# Patient Record
Sex: Female | Born: 1950 | Race: White | Hispanic: No | Marital: Married | Smoking: Former smoker
Health system: Southern US, Community
[De-identification: ages and names within clinical notes are randomized; demographics above are authoritative.]

## PROBLEM LIST (undated history)

## (undated) DIAGNOSIS — J439 Emphysema, unspecified: Secondary | ICD-10-CM

## (undated) HISTORY — PX: OTHER SURGICAL HISTORY: SHX169

---

## 2014-12-04 DIAGNOSIS — Z85828 Personal history of other malignant neoplasm of skin: Secondary | ICD-10-CM | POA: Insufficient documentation

## 2015-09-24 ENCOUNTER — Encounter: Payer: Self-pay | Admitting: Gastroenterology

## 2015-09-24 NOTE — Telephone Encounter (Signed)
Error

## 2016-04-14 ENCOUNTER — Ambulatory Visit
Admission: RE | Admit: 2016-04-14 | Discharge: 2016-04-14 | Disposition: A | Discharge status: Home / Self Care | Payer: Self-pay | Source: Ambulatory Visit | Attending: Oncology | Admitting: Oncology

## 2016-04-14 ENCOUNTER — Ambulatory Visit: Payer: Self-pay | Attending: Oncology

## 2016-04-14 VITALS — BP 118/62 | HR 74 | Temp 96.6°F | Resp 18 | Ht 62.6 in | Wt 103.1 lb

## 2016-04-14 DIAGNOSIS — Z0000 Encounter for general adult medical examination without abnormal findings: Secondary | ICD-10-CM

## 2016-04-14 NOTE — Progress Notes (Signed)
Patient had last mammogram at [facility].  [facility] to notify of normal mammogram results.  Patient to return in one year for annual screening.  Copy to [facility].

## 2016-04-14 NOTE — Progress Notes (Signed)
Subjective:     [name], female   DOB: 10/14/1950, 65 y.o.   MRN: [id number]  HPI   Review of Systems     Objective:   Physical Exam  Pulmonary/Chest: Right breast exhibits no inverted nipple, no mass, no nipple discharge, no skin change and no tenderness. Left breast exhibits no inverted nipple, no mass, no nipple discharge, no skin change and no tenderness. Breasts are symmetrical.       Assessment:     65 [facility]  Patient screened, and meets [facility] eligibility.  Patient does not have insurance, Medicare or Medicaid.  Handout given on Affordable Care Act. Instructed patient on breast self-exam using teach back method.  CBE unremarkable.  Prior to living in [place], patient lived in [place].  [facility].    Plan:     Sent for bilateral screening mammogram.

## 2016-04-28 ENCOUNTER — Inpatient Hospital Stay
Admission: RE | Admit: 2016-04-28 | Discharge: 2016-04-28 | Disposition: A | Discharge status: Home / Self Care | Payer: Self-pay | Source: Ambulatory Visit | Attending: *Deleted | Admitting: *Deleted

## 2016-04-28 ENCOUNTER — Other Ambulatory Visit: Payer: Self-pay | Admitting: *Deleted

## 2016-04-28 DIAGNOSIS — Z9289 Personal history of other medical treatment: Secondary | ICD-10-CM

## 2017-07-18 ENCOUNTER — Other Ambulatory Visit: Payer: Self-pay | Admitting: Family Medicine

## 2017-07-18 DIAGNOSIS — E2839 Other primary ovarian failure: Secondary | ICD-10-CM

## 2017-07-20 ENCOUNTER — Telehealth: Payer: Self-pay | Admitting: *Deleted

## 2017-07-20 NOTE — Telephone Encounter (Signed)
Received referral for low dose lung cancer screening CT scan. Message left at phone number listed in EMR for patient to call me back to facilitate scheduling scan.  

## 2017-07-27 ENCOUNTER — Telehealth: Payer: Self-pay | Admitting: *Deleted

## 2017-07-27 NOTE — Telephone Encounter (Signed)
Received referral for initial lung cancer screening scan. Contacted patient who would like to think about screening for now. She has my contact number when and if she decides to have lung screening scan.

## 2017-08-18 ENCOUNTER — Encounter: Payer: Self-pay | Admitting: *Deleted

## 2017-09-07 ENCOUNTER — Telehealth: Payer: Self-pay | Admitting: *Deleted

## 2017-09-07 DIAGNOSIS — Z122 Encounter for screening for malignant neoplasm of respiratory organs: Secondary | ICD-10-CM

## 2017-09-07 DIAGNOSIS — Z87891 Personal history of nicotine dependence: Secondary | ICD-10-CM

## 2017-09-07 NOTE — Telephone Encounter (Signed)
Received referral for initial lung cancer screening scan. Contacted patient and obtained smoking history,(current, 36 pack year) as well as answering questions related to screening process. Patient denies signs of lung cancer such as weight loss or hemoptysis. Patient denies comorbidity that would prevent curative treatment if lung cancer were found. Patient is scheduled for shared decision making visit and CT scan on 09/30/17.

## 2017-09-28 ENCOUNTER — Other Ambulatory Visit: Payer: Self-pay | Admitting: Family Medicine

## 2017-09-28 DIAGNOSIS — J449 Chronic obstructive pulmonary disease, unspecified: Secondary | ICD-10-CM

## 2017-09-30 ENCOUNTER — Inpatient Hospital Stay: Payer: Medicare Other | Attending: Nurse Practitioner | Admitting: Nurse Practitioner

## 2017-09-30 ENCOUNTER — Telehealth: Payer: Self-pay | Admitting: *Deleted

## 2017-09-30 ENCOUNTER — Encounter: Payer: Self-pay | Admitting: Nurse Practitioner

## 2017-09-30 ENCOUNTER — Ambulatory Visit
Admission: RE | Admit: 2017-09-30 | Discharge: 2017-09-30 | Disposition: A | Discharge status: Home / Self Care | Payer: Medicare Other | Source: Ambulatory Visit | Attending: Nurse Practitioner | Admitting: Nurse Practitioner

## 2017-09-30 DIAGNOSIS — J439 Emphysema, unspecified: Secondary | ICD-10-CM | POA: Insufficient documentation

## 2017-09-30 DIAGNOSIS — Z87891 Personal history of nicotine dependence: Secondary | ICD-10-CM

## 2017-09-30 DIAGNOSIS — Z122 Encounter for screening for malignant neoplasm of respiratory organs: Secondary | ICD-10-CM

## 2017-09-30 DIAGNOSIS — I700 Atherosclerosis of aorta: Secondary | ICD-10-CM | POA: Diagnosis not present

## 2017-09-30 NOTE — Progress Notes (Signed)
In accordance with CMS guidelines, patient has met eligibility criteria including age, absence of signs or symptoms of lung cancer.  Social History   Tobacco Use  . Smoking status: Current Every Day Smoker    Packs/day: 0.75    Years: 48.00    Pack years: 36.00    Types: Cigarettes  Substance Use Topics  . Alcohol use: Not on file  . Drug use: Not on file      A shared decision-making session was conducted prior to the performance of CT scan. This includes one or more decision aids, includes benefits and harms of screening, follow-up diagnostic testing, over-diagnosis, false positive rate, and total radiation exposure.   Counseling on the importance of adherence to annual lung cancer LDCT screening, impact of co-morbidities, and ability or willingness to undergo diagnosis and treatment is imperative for compliance of the program.   Counseling on the importance of continued smoking cessation for former smokers; the importance of smoking cessation for current smokers, and information about tobacco cessation interventions have been given to patient including [place] and 1800 quit [place] programs.   Written order for lung cancer screening with LDCT has been given to the patient and any and all questions have been answered to the best of my abilities.    Yearly follow up will be coordinated by Burgess Estelle, Thoracic Navigator.  Beckey Rutter, DNP, AGNP-C [facility] [phone] (work cell) [phone] (office) 09/30/17 11:26 AM

## 2017-09-30 NOTE — Telephone Encounter (Signed)
error 

## 2017-10-03 ENCOUNTER — Encounter: Payer: Self-pay | Admitting: *Deleted

## 2017-10-04 ENCOUNTER — Other Ambulatory Visit: Payer: Self-pay | Admitting: Family Medicine

## 2017-10-04 DIAGNOSIS — E2839 Other primary ovarian failure: Secondary | ICD-10-CM

## 2018-01-25 ENCOUNTER — Other Ambulatory Visit: Payer: Self-pay | Admitting: Family Medicine

## 2018-01-25 DIAGNOSIS — Z1231 Encounter for screening mammogram for malignant neoplasm of breast: Secondary | ICD-10-CM

## 2018-01-30 ENCOUNTER — Ambulatory Visit
Admission: RE | Admit: 2018-01-30 | Discharge: 2018-01-30 | Disposition: A | Discharge status: Home / Self Care | Payer: Medicare Other | Source: Ambulatory Visit | Attending: Family Medicine | Admitting: Family Medicine

## 2018-01-30 ENCOUNTER — Encounter (INDEPENDENT_AMBULATORY_CARE_PROVIDER_SITE_OTHER): Payer: Self-pay

## 2018-01-30 DIAGNOSIS — E2839 Other primary ovarian failure: Secondary | ICD-10-CM | POA: Insufficient documentation

## 2018-05-15 ENCOUNTER — Emergency Department: Payer: Medicare Other

## 2018-05-15 ENCOUNTER — Inpatient Hospital Stay
Admission: EM | Admit: 2018-05-15 | Discharge: 2018-05-21 | DRG: 208 | Disposition: A | Discharge status: Home / Self Care | Payer: Medicare Other | Attending: Internal Medicine | Admitting: Internal Medicine

## 2018-05-15 ENCOUNTER — Inpatient Hospital Stay: Payer: Medicare Other

## 2018-05-15 ENCOUNTER — Encounter: Payer: Self-pay | Admitting: Emergency Medicine

## 2018-05-15 ENCOUNTER — Other Ambulatory Visit: Payer: Self-pay

## 2018-05-15 DIAGNOSIS — J9621 Acute and chronic respiratory failure with hypoxia: Secondary | ICD-10-CM | POA: Diagnosis present

## 2018-05-15 DIAGNOSIS — Z880 Allergy status to penicillin: Secondary | ICD-10-CM | POA: Diagnosis not present

## 2018-05-15 DIAGNOSIS — Z79899 Other long term (current) drug therapy: Secondary | ICD-10-CM | POA: Diagnosis not present

## 2018-05-15 DIAGNOSIS — J208 Acute bronchitis due to other specified organisms: Secondary | ICD-10-CM | POA: Diagnosis present

## 2018-05-15 DIAGNOSIS — Z7902 Long term (current) use of antithrombotics/antiplatelets: Secondary | ICD-10-CM | POA: Diagnosis not present

## 2018-05-15 DIAGNOSIS — J9601 Acute respiratory failure with hypoxia: Secondary | ICD-10-CM | POA: Diagnosis present

## 2018-05-15 DIAGNOSIS — Z681 Body mass index (BMI) 19 or less, adult: Secondary | ICD-10-CM

## 2018-05-15 DIAGNOSIS — J441 Chronic obstructive pulmonary disease with (acute) exacerbation: Principal | ICD-10-CM | POA: Diagnosis present

## 2018-05-15 DIAGNOSIS — F17210 Nicotine dependence, cigarettes, uncomplicated: Secondary | ICD-10-CM | POA: Diagnosis present

## 2018-05-15 DIAGNOSIS — I248 Other forms of acute ischemic heart disease: Secondary | ICD-10-CM | POA: Diagnosis present

## 2018-05-15 DIAGNOSIS — Z794 Long term (current) use of insulin: Secondary | ICD-10-CM

## 2018-05-15 DIAGNOSIS — R778 Other specified abnormalities of plasma proteins: Secondary | ICD-10-CM

## 2018-05-15 DIAGNOSIS — E43 Unspecified severe protein-calorie malnutrition: Secondary | ICD-10-CM | POA: Diagnosis present

## 2018-05-15 DIAGNOSIS — Z882 Allergy status to sulfonamides status: Secondary | ICD-10-CM

## 2018-05-15 DIAGNOSIS — R0602 Shortness of breath: Secondary | ICD-10-CM | POA: Diagnosis present

## 2018-05-15 DIAGNOSIS — E872 Acidosis: Secondary | ICD-10-CM | POA: Diagnosis present

## 2018-05-15 DIAGNOSIS — J440 Chronic obstructive pulmonary disease with acute lower respiratory infection: Secondary | ICD-10-CM | POA: Diagnosis present

## 2018-05-15 DIAGNOSIS — Z4659 Encounter for fitting and adjustment of other gastrointestinal appliance and device: Secondary | ICD-10-CM

## 2018-05-15 DIAGNOSIS — J9602 Acute respiratory failure with hypercapnia: Secondary | ICD-10-CM

## 2018-05-15 DIAGNOSIS — E440 Moderate protein-calorie malnutrition: Secondary | ICD-10-CM

## 2018-05-15 DIAGNOSIS — J9622 Acute and chronic respiratory failure with hypercapnia: Secondary | ICD-10-CM | POA: Diagnosis present

## 2018-05-15 DIAGNOSIS — J189 Pneumonia, unspecified organism: Secondary | ICD-10-CM

## 2018-05-15 DIAGNOSIS — R7989 Other specified abnormal findings of blood chemistry: Secondary | ICD-10-CM

## 2018-05-15 DIAGNOSIS — I340 Nonrheumatic mitral (valve) insufficiency: Secondary | ICD-10-CM | POA: Diagnosis not present

## 2018-05-15 HISTORY — DX: Emphysema, unspecified: J43.9

## 2018-05-15 LAB — BLOOD GAS, VENOUS
ACID-BASE EXCESS: 5.4 mmol/L — AB (ref 0.0–2.0)
Bicarbonate: 36.4 mmol/L — ABNORMAL HIGH (ref 20.0–28.0)
Delivery systems: POSITIVE
FIO2: 30
Mechanical Rate: 20
O2 SAT: 96.3 %
PCO2 VEN: 89 mmHg — AB (ref 44.0–60.0)
Patient temperature: 37
pH, Ven: 7.22 — ABNORMAL LOW (ref 7.250–7.430)
pO2, Ven: 99 mmHg — ABNORMAL HIGH (ref 32.0–45.0)

## 2018-05-15 LAB — MRSA PCR SCREENING: MRSA by PCR: NEGATIVE

## 2018-05-15 LAB — BLOOD GAS, ARTERIAL
Acid-Base Excess: 4.7 mmol/L — ABNORMAL HIGH (ref 0.0–2.0)
Bicarbonate: 30.9 mmol/L — ABNORMAL HIGH (ref 20.0–28.0)
FIO2: 0.4
MECHVT: 400 mL
Mechanical Rate: 22
O2 Saturation: 96 %
PEEP: 5 cmH2O
Patient temperature: 37
pCO2 arterial: 51 mmHg — ABNORMAL HIGH (ref 32.0–48.0)
pH, Arterial: 7.39 (ref 7.350–7.450)
pO2, Arterial: 83 mmHg (ref 83.0–108.0)

## 2018-05-15 LAB — CBC WITH DIFFERENTIAL/PLATELET
Abs Immature Granulocytes: 0.02 10*3/uL (ref 0.00–0.07)
BASOS ABS: 0 10*3/uL (ref 0.0–0.1)
Basophils Relative: 0 %
EOS ABS: 0 10*3/uL (ref 0.0–0.5)
EOS PCT: 0 %
HCT: 46.4 % — ABNORMAL HIGH (ref 36.0–46.0)
Hemoglobin: 14.8 g/dL (ref 12.0–15.0)
Immature Granulocytes: 0 %
Lymphocytes Relative: 26 %
Lymphs Abs: 2.4 10*3/uL (ref 0.7–4.0)
MCH: 32.6 pg (ref 26.0–34.0)
MCHC: 31.9 g/dL (ref 30.0–36.0)
MCV: 102.2 fL — ABNORMAL HIGH (ref 80.0–100.0)
Monocytes Absolute: 1.4 10*3/uL — ABNORMAL HIGH (ref 0.1–1.0)
Monocytes Relative: 15 %
NRBC: 0 % (ref 0.0–0.2)
Neutro Abs: 5.4 10*3/uL (ref 1.7–7.7)
Neutrophils Relative %: 59 %
PLATELETS: 437 10*3/uL — AB (ref 150–400)
RBC: 4.54 MIL/uL (ref 3.87–5.11)
RDW: 13 % (ref 11.5–15.5)
WBC: 9.2 10*3/uL (ref 4.0–10.5)

## 2018-05-15 LAB — GLUCOSE, CAPILLARY: Glucose-Capillary: 142 mg/dL — ABNORMAL HIGH (ref 70–99)

## 2018-05-15 LAB — COMPREHENSIVE METABOLIC PANEL
ALT: 60 U/L — ABNORMAL HIGH (ref 0–44)
ANION GAP: 7 (ref 5–15)
AST: 49 U/L — ABNORMAL HIGH (ref 15–41)
Albumin: 4.6 g/dL (ref 3.5–5.0)
Alkaline Phosphatase: 96 U/L (ref 38–126)
BUN: 11 mg/dL (ref 8–23)
CALCIUM: 9.1 mg/dL (ref 8.9–10.3)
CHLORIDE: 97 mmol/L — AB (ref 98–111)
CO2: 33 mmol/L — AB (ref 22–32)
Creatinine, Ser: 0.49 mg/dL (ref 0.44–1.00)
GFR calc non Af Amer: >60 mL/min (ref >60)
Glucose, Bld: 119 mg/dL — ABNORMAL HIGH (ref 70–99)
POTASSIUM: 4.1 mmol/L (ref 3.5–5.1)
SODIUM: 137 mmol/L (ref 135–145)
Total Bilirubin: 0.4 mg/dL (ref 0.3–1.2)
Total Protein: 7.7 g/dL (ref 6.5–8.1)

## 2018-05-15 LAB — INFLUENZA PANEL BY PCR (TYPE A & B)
Influenza A By PCR: NEGATIVE
Influenza B By PCR: NEGATIVE

## 2018-05-15 LAB — PROTIME-INR
INR: 0.85
PROTHROMBIN TIME: 11.5 s (ref 11.4–15.2)

## 2018-05-15 LAB — TROPONIN I
Troponin I: 0.49 ng/mL (ref <0.03)
Troponin I: 1.01 ng/mL (ref <0.03)
Troponin I: 0.63 ng/mL (ref <0.03)
Troponin I: 0.28 ng/mL (ref <0.03)

## 2018-05-15 LAB — MAGNESIUM: MAGNESIUM: 2.2 mg/dL (ref 1.7–2.4)

## 2018-05-15 LAB — BRAIN NATRIURETIC PEPTIDE: B NATRIURETIC PEPTIDE 5: 182 pg/mL — AB (ref 0.0–100.0)

## 2018-05-15 LAB — TSH: TSH: 0.835 u[IU]/mL (ref 0.350–4.500)

## 2018-05-15 MED ORDER — BUDESONIDE 0.25 MG/2ML IN SUSP
0.2500 mg | Freq: Two times a day (BID) | RESPIRATORY_TRACT | Status: DC
  Administered 2018-05-15: 0.25 mg via RESPIRATORY_TRACT
  Filled 2018-05-15: qty 2

## 2018-05-15 MED ORDER — SODIUM CHLORIDE 0.9 % IV SOLN
500.0000 mg | INTRAVENOUS | Status: DC
  Administered 2018-05-16 – 2018-05-17 (×2): 500 mg via INTRAVENOUS
  Filled 2018-05-15 (×2): qty 500

## 2018-05-15 MED ORDER — ALPRAZOLAM 0.25 MG PO TABS: 0.2500 mg | ORAL_TABLET | Freq: Three times a day (TID) | ORAL | Status: DC | PRN

## 2018-05-15 MED ORDER — IPRATROPIUM-ALBUTEROL 0.5-2.5 (3) MG/3ML IN SOLN
RESPIRATORY_TRACT | Status: AC
  Filled 2018-05-15: qty 9

## 2018-05-15 MED ORDER — ROCURONIUM BROMIDE 50 MG/5ML IV SOLN
70.0000 mg | Freq: Once | INTRAVENOUS | Status: AC
  Administered 2018-05-15: 70 mg via INTRAVENOUS

## 2018-05-15 MED ORDER — MAGNESIUM SULFATE 2 GM/50ML IV SOLN
2.0000 g | Freq: Once | INTRAVENOUS | Status: AC
  Administered 2018-05-15: 2 g via INTRAVENOUS
  Filled 2018-05-15: qty 50

## 2018-05-15 MED ORDER — PROPOFOL 1000 MG/100ML IV EMUL: 5.0000 ug/kg/min | INTRAVENOUS | Status: DC

## 2018-05-15 MED ORDER — SODIUM CHLORIDE 0.9 % IV SOLN
500.0000 mg | INTRAVENOUS | Status: DC
  Filled 2018-05-15: qty 500

## 2018-05-15 MED ORDER — ACETAMINOPHEN 325 MG PO TABS
650.0000 mg | ORAL_TABLET | Freq: Four times a day (QID) | ORAL | Status: DC | PRN
  Administered 2018-05-15 – 2018-05-20 (×8): 650 mg via ORAL
  Filled 2018-05-15 (×8): qty 2

## 2018-05-15 MED ORDER — ORAL CARE MOUTH RINSE: 15.0000 mL | OROMUCOSAL | Status: DC

## 2018-05-15 MED ORDER — ORAL CARE MOUTH RINSE: 15.0000 mL | Freq: Two times a day (BID) | OROMUCOSAL | Status: DC

## 2018-05-15 MED ORDER — NICOTINE 14 MG/24HR TD PT24
14.0000 mg | MEDICATED_PATCH | Freq: Every day | TRANSDERMAL | Status: DC
  Filled 2018-05-15: qty 1

## 2018-05-15 MED ORDER — ORAL CARE MOUTH RINSE
15.0000 mL | OROMUCOSAL | Status: DC
  Administered 2018-05-15: 15 mL via OROMUCOSAL

## 2018-05-15 MED ORDER — PANTOPRAZOLE SODIUM 40 MG IV SOLR
40.0000 mg | INTRAVENOUS | Status: DC
  Administered 2018-05-15: 40 mg via INTRAVENOUS
  Filled 2018-05-15: qty 40

## 2018-05-15 MED ORDER — ONDANSETRON HCL 4 MG PO TABS: 4.0000 mg | ORAL_TABLET | Freq: Four times a day (QID) | ORAL | Status: DC | PRN

## 2018-05-15 MED ORDER — CHLORHEXIDINE GLUCONATE 0.12% ORAL RINSE (MEDLINE KIT)
15.0000 mL | Freq: Two times a day (BID) | OROMUCOSAL | Status: DC
  Administered 2018-05-15: 15 mL via OROMUCOSAL

## 2018-05-15 MED ORDER — ALBUTEROL SULFATE (2.5 MG/3ML) 0.083% IN NEBU
7.5000 mg | INHALATION_SOLUTION | Freq: Once | RESPIRATORY_TRACT | Status: AC
  Administered 2018-05-15: 7.5 mg via RESPIRATORY_TRACT

## 2018-05-15 MED ORDER — FENTANYL 2500MCG IN NS 250ML (10MCG/ML) PREMIX INFUSION
0.0000 ug/h | INTRAVENOUS | Status: DC
Start: 2018-05-15 — End: 2018-05-15
  Administered 2018-05-15: 250 ug/h via INTRAVENOUS
  Administered 2018-05-15: 100 ug/h via INTRAVENOUS
  Filled 2018-05-15 (×2): qty 250

## 2018-05-15 MED ORDER — ONDANSETRON HCL 4 MG/2ML IJ SOLN
4.0000 mg | Freq: Four times a day (QID) | INTRAMUSCULAR | Status: DC | PRN
  Administered 2018-05-15: 4 mg via INTRAVENOUS
  Filled 2018-05-15: qty 2

## 2018-05-15 MED ORDER — SODIUM CHLORIDE 0.9 % IV SOLN
500.0000 mg | Freq: Once | INTRAVENOUS | Status: AC
  Administered 2018-05-15: 500 mg via INTRAVENOUS
  Filled 2018-05-15: qty 500

## 2018-05-15 MED ORDER — NICOTINE 21 MG/24HR TD PT24: 21.0000 mg | MEDICATED_PATCH | Freq: Every day | TRANSDERMAL | Status: DC

## 2018-05-15 MED ORDER — IPRATROPIUM-ALBUTEROL 0.5-2.5 (3) MG/3ML IN SOLN
3.0000 mL | Freq: Four times a day (QID) | RESPIRATORY_TRACT | Status: DC | PRN
  Administered 2018-05-16 – 2018-05-17 (×4): 3 mL via RESPIRATORY_TRACT
  Filled 2018-05-15 (×4): qty 3

## 2018-05-15 MED ORDER — FENTANYL 2500MCG IN NS 250ML (10MCG/ML) PREMIX INFUSION: 100.0000 ug/h | INTRAVENOUS | Status: DC

## 2018-05-15 MED ORDER — ALBUTEROL SULFATE (2.5 MG/3ML) 0.083% IN NEBU
INHALATION_SOLUTION | RESPIRATORY_TRACT | Status: AC
  Filled 2018-05-15: qty 9

## 2018-05-15 MED ORDER — IPRATROPIUM-ALBUTEROL 0.5-2.5 (3) MG/3ML IN SOLN
3.0000 mL | Freq: Once | RESPIRATORY_TRACT | Status: AC
Start: 2018-05-15 — End: 2018-05-15
  Administered 2018-05-15: 3 mL via RESPIRATORY_TRACT

## 2018-05-15 MED ORDER — METHYLPREDNISOLONE SODIUM SUCC 125 MG IJ SOLR: 60.0000 mg | Freq: Two times a day (BID) | INTRAMUSCULAR | Status: DC

## 2018-05-15 MED ORDER — CHLORHEXIDINE GLUCONATE 0.12% ORAL RINSE (MEDLINE KIT): 15.0000 mL | Freq: Two times a day (BID) | OROMUCOSAL | Status: DC

## 2018-05-15 MED ORDER — METHYLPREDNISOLONE SODIUM SUCC 125 MG IJ SOLR
125.0000 mg | Freq: Once | INTRAMUSCULAR | Status: AC
  Administered 2018-05-15: 125 mg via INTRAVENOUS
  Filled 2018-05-15: qty 2

## 2018-05-15 MED ORDER — IPRATROPIUM-ALBUTEROL 0.5-2.5 (3) MG/3ML IN SOLN
3.0000 mL | RESPIRATORY_TRACT | Status: DC
  Administered 2018-05-15 – 2018-05-16 (×7): 3 mL via RESPIRATORY_TRACT
  Filled 2018-05-15 (×7): qty 3

## 2018-05-15 MED ORDER — KETAMINE HCL 10 MG/ML IJ SOLN
90.0000 mg | Freq: Once | INTRAMUSCULAR | Status: AC
  Administered 2018-05-15: 90 mg via INTRAVENOUS

## 2018-05-15 MED ORDER — PROPOFOL 1000 MG/100ML IV EMUL
20.0000 ug/kg/min | INTRAVENOUS | Status: DC
  Administered 2018-05-15: 20 ug/kg/min via INTRAVENOUS

## 2018-05-15 MED ORDER — SUCCINYLCHOLINE CHLORIDE 20 MG/ML IJ SOLN: 100.0000 mg | Freq: Once | INTRAMUSCULAR | Status: DC

## 2018-05-15 MED ORDER — SODIUM CHLORIDE 0.9 % IV BOLUS
1000.0000 mL | Freq: Once | INTRAVENOUS | Status: AC
  Administered 2018-05-15: 1000 mL via INTRAVENOUS

## 2018-05-15 MED ORDER — NICOTINE 7 MG/24HR TD PT24
7.0000 mg | MEDICATED_PATCH | Freq: Every day | TRANSDERMAL | Status: DC
  Administered 2018-05-15 – 2018-05-16 (×2): 7 mg via TRANSDERMAL
  Filled 2018-05-15 (×2): qty 1

## 2018-05-15 MED ORDER — METHYLPREDNISOLONE SODIUM SUCC 40 MG IJ SOLR
40.0000 mg | Freq: Two times a day (BID) | INTRAMUSCULAR | Status: DC
  Administered 2018-05-15 – 2018-05-16 (×2): 40 mg via INTRAVENOUS
  Filled 2018-05-15 (×2): qty 1

## 2018-05-15 MED ORDER — ACETAMINOPHEN 650 MG RE SUPP: 650.0000 mg | Freq: Four times a day (QID) | RECTAL | Status: DC | PRN

## 2018-05-15 MED ORDER — IPRATROPIUM-ALBUTEROL 0.5-2.5 (3) MG/3ML IN SOLN
3.0000 mL | Freq: Once | RESPIRATORY_TRACT | Status: AC
  Administered 2018-05-15: 3 mL via RESPIRATORY_TRACT

## 2018-05-15 MED ORDER — ETOMIDATE 2 MG/ML IV SOLN: 15.0000 mg | Freq: Once | INTRAVENOUS | Status: DC

## 2018-05-15 MED ORDER — PROPOFOL 1000 MG/100ML IV EMUL
5.0000 ug/kg/min | INTRAVENOUS | Status: DC
  Filled 2018-05-15: qty 100

## 2018-05-15 MED ORDER — ENOXAPARIN SODIUM 40 MG/0.4ML ~~LOC~~ SOLN: 40.0000 mg | SUBCUTANEOUS | Status: DC

## 2018-05-15 MED ORDER — BUDESONIDE 0.5 MG/2ML IN SUSP
0.5000 mg | Freq: Two times a day (BID) | RESPIRATORY_TRACT | Status: DC
  Administered 2018-05-15 – 2018-05-16 (×2): 0.5 mg via RESPIRATORY_TRACT
  Filled 2018-05-15 (×2): qty 2

## 2018-05-15 NOTE — ED Notes (Signed)
MD at the bedside for pt re-evaluation. Pt states she is feeling "a little" better. Pt continues to appear anxious at this time. Bi-pap in place.

## 2018-05-15 NOTE — ED Notes (Signed)
Pt resting quietly with iv medications infusing without difficulty. No distress noted. Provided for pt safety and comfort. MD aware of pt status. Will continue to assess.

## 2018-05-15 NOTE — Consult Note (Signed)
CRITICAL CARE NOTE  CC  REsp failure  SUBJECTIVE Admitted for COPD exacerbation Intubated for severe resp failure Failed biPAP Increased WOB for last several days      SIGNIFICANT EVENTS 1/13 intubated   BP 114/73 (BP Location: Left Arm)   Pulse 67   Temp (!) 97.5 F (36.4 C) (Axillary)   Resp (!) 22   Ht 5' 2"  (1.575 m)   Wt 46.1 kg   SpO2 100%   BMI 18.59 kg/m    REVIEW OF SYSTEMS  PATIENT IS UNABLE TO PROVIDE COMPLETE REVIEW OF SYSTEMS DUE TO SEVERE CRITICAL ILLNESS   PHYSICAL EXAMINATION:  GENERAL:critically ill appearing, +resp distress HEAD: Normocephalic, atraumatic.  EYES: Pupils equal, round, reactive to light.  No scleral icterus.  MOUTH: Moist mucosal membrane. NECK: Supple. No thyromegaly. No nodules. No JVD.  PULMONARY: +rhonchi, +wheezing CARDIOVASCULAR: S1 and S2. Regular rate and rhythm. No murmurs, rubs, or gallops.  GASTROINTESTINAL: Soft, nontender, -distended. No masses. Positive bowel sounds. No hepatosplenomegaly.  MUSCULOSKELETAL: No swelling, clubbing, or edema.  NEUROLOGIC: obtunded, GCS<8 SKIN:intact,warm,dry      Indwelling Urinary Catheter continued, requirement due to   Reason to continue Indwelling Urinary Catheter for strict Intake/Output monitoring for hemodynamic instability         Ventilator continued, requirement due to, resp failure    Ventilator Sedation RASS 0 to -2     ASSESSMENT AND PLAN SYNOPSIS   Severe Hypoxic and Hypercapnic Respiratory Failure/COPD exacerbation from viral bronchitis -continue Full MV support -continue Bronchodilator Therapy -Wean Fio2 and PEEP as tolerated -will perform SAT/SBT when respiratory parameters are met   NEUROLOGY - intubated and sedated - minimal sedation to achieve a RASS goal: -1 Wake up assessment pending   SEVERE COPD EXACERBATION -continue IV steroids as prescribed -continue NEB THERAPY as prescribed -morphine as needed -wean fio2 as needed and  tolerated     CARDIAC ICU monitoring  ID -continue IV abx as prescibed -follow up cultures  GI/Nutrition GI PROPHYLAXIS as indicated DIET-->TF's as tolerated Constipation protocol as indicated  ENDO - ICU hypoglycemic\Hyperglycemia protocol -check FSBS per protocol   ELECTROLYTES -follow labs as needed -replace as needed -pharmacy consultation and following   DVT/GI PRX ordered TRANSFUSIONS AS NEEDED MONITOR FSBS ASSESS the need for LABS as needed   Critical Care Time devoted to patient care services described in this note is 34 minutes.   Overall, patient is critically ill, prognosis is guarded.  Patient with Multiorgan failure and at high risk for cardiac arrest and death.    Corrin Parker, M.D.  [facility]  Medical Director [place] Director [facility] Cardio-Pulmonary Department

## 2018-05-15 NOTE — Consult Note (Addendum)
PULMONARY / CRITICAL CARE MEDICINE  Name: Audrey Ochoa MRN: [id number] DOB: 11-26-1950    LOS: 0  Referring Provider: Dr. Sheryle Hail Reason for Referral: Acute hypoxic respiratory failure requiring intubation Brief patient description: 68 year old female admitted with acute hypoxic/hypercarbic respiratory failure; now intubated and sedated  HPI: This is a 68 year old female, current everyday smoker, with a history of emphysema who presented to the ED with worsening shortness of breath.  History is obtained from ED records and from patient's spouse as she is currently intubated and sedated.  Per patient's spouse, abdominal 3 days ago, patient started developing what appeared to be cold symptoms associated with progressive shortness of breath.  She tried using her home nebulizers as well as her husband's oxygen without any significant relief.  She was seen by her PCP on Saturday and placed on doxycycline and a prednisone taper.  This did not help her symptoms.  Symptoms got worse today hence she came to the emergency room for evaluation.  Upon arrival in the ED, she was placed on BiPAP briefly and given multiple nebulizer treatments.  Her VBG showed acidosis with a pH of 7.22 and PCO2 of 89.  Due to no improvement, patient was emergently intubated. Per patient's husband, patient was exposed to her granddaughter who had a cold and fever.  Past Medical History:  Diagnosis Date  . Emphysema of lung (HCC)    History reviewed. No pertinent surgical history. Prior to Admission medications   Medication Sig Start Date End Date Taking? Authorizing Provider  amLODipine (NORVASC) 5 MG tablet Take 5 mg by mouth daily.   Yes [provider]  clopidogrel (PLAVIX) 75 MG tablet Take 75 mg by mouth daily.   Yes [provider]  donepezil (ARICEPT) 5 MG tablet Take 1 tablet (5 mg total) by mouth at bedtime. 12/26/17 02/04/18 Yes Sowles, Danna Hefty, MD  empagliflozin (JARDIANCE) 25 MG TABS tablet  Take 25 mg by mouth daily.   Yes [provider]  glycopyrrolate (ROBINUL) 1 MG tablet Take 1 mg by mouth 2 (two) times daily.   Yes [provider]  insulin aspart (NOVOLOG FLEXPEN) 100 UNIT/ML FlexPen Inject 12 Units into the skin 2 (two) times daily.   Yes [provider]  insulin aspart (NOVOLOG) 100 UNIT/ML FlexPen Inject 18 Units into the skin daily. At 1700   Yes [provider]  Insulin Degludec-Liraglutide (XULTOPHY) 100-3.6 UNIT-MG/ML SOPN Inject 50 Units into the skin daily.   Yes [provider]  levETIRAcetam (KEPPRA) 500 MG tablet Take 500 mg by mouth 2 (two) times daily.   Yes [provider]  lipase/protease/amylase (CREON) 12000 units CPEP capsule Take 6,000 Units by mouth 3 (three) times daily before meals.   Yes [provider]  lipase/protease/amylase (CREON) 12000 units CPEP capsule Take 3,000 Units by mouth at bedtime. With snack   Yes [provider]  lisinopril (PRINIVIL,ZESTRIL) 5 MG tablet Take 5 mg by mouth daily.   Yes [provider]  metoprolol succinate (TOPROL-XL) 25 MG 24 hr tablet Take 1 tablet (25 mg total) by mouth daily. 12/26/17  Yes Sowles, Danna Hefty, MD  rosuvastatin (CRESTOR) 40 MG tablet Take 1 tablet (40 mg total) by mouth daily. 12/26/17 02/04/18 Yes Alba Cory, MD  aspirin EC 81 MG tablet Take 81 mg by mouth daily.    [provider]  famotidine (PEPCID) 20 MG tablet Take 1 tablet (20 mg total) by mouth 2 (two) times daily. 12/26/17 01/25/18  Alba Cory, MD  gabapentin (  NEURONTIN) 300 MG capsule Take 1 capsule (300 mg total) by mouth 2 (two) times daily. 12/26/17 01/25/18  Alba Cory, MD  insulin glargine (LANTUS) 100 UNIT/ML injection Inject 0.1 mLs (10 Units total) into the skin daily. 12/26/17 01/25/18  Alba Cory, MD  lacosamide 100 MG TABS Take 1 tablet (100 mg total) by mouth 2 (two) times daily. Patient not taking: Reported on 02/04/2018 05/13/17    Enedina Finner, MD  promethazine (PHENERGAN) 12.5 MG tablet Take 1 tablet (12.5 mg total) by mouth every 6 (six) hours as needed for nausea or vomiting. Patient not taking: Reported on 02/04/2018 03/01/17   Almond Lint, MD  sertraline (ZOLOFT) 25 MG tablet Take 1 tablet (25 mg total) by mouth daily. Patient not taking: Reported on 02/04/2018 12/26/17   Alba Cory, MD   Allergies Allergies  Allergen Reactions  . Penicillins   . Sulfa Antibiotics     Family History History reviewed. No pertinent family history. Social History  reports that she has been smoking cigarettes. She has a 36.00 pack-year smoking history. She has never used smokeless tobacco. She reports current alcohol use. No history on file for drug.  Review Of Systems: Unable to obtain  VITAL SIGNS: BP 114/73 (BP Location: Left Arm)   Pulse 67   Temp (!) 97.5 F (36.4 C) (Axillary)   Resp (!) 22   Ht 5\' 2"  (1.575 m)   Wt 46.1 kg   SpO2 100%   BMI 18.59 kg/m   HEMODYNAMICS:    VENTILATOR SETTINGS: Vent Mode: AC FiO2 (%):  [40 %] 40 % Set Rate:  [22 bmp] 22 bmp Vt Set:  [400 mL] 400 mL PEEP:  [5 cmH20] 5 cmH20  INTAKE / OUTPUT: No intake/output data recorded.  PHYSICAL EXAMINATION: General: Intubated and sedated HEENT: ET tube, trachea midline, no JVD Neuro: Sedated, corneal reflexes intact, gag reflex intact Cardiovascular: Apical pulse regular, S1-S2, no murmur regurg or gallop, no edema Lungs: Bilateral breath sounds diminished in all lung fields with expiratory wheezes in all lung fields Abdomen: Nondistended, normal bowel sounds in all 4 quadrants Musculoskeletal: Positive range of motion, no deformities Skin: Warm and dry  LABS:  BMET Recent Labs  Lab 05/15/18 0213  NA 137  K 4.1  CL 97*  CO2 33*  BUN 11  CREATININE 0.49  GLUCOSE 119*    Electrolytes Recent Labs  Lab 05/15/18 0213  CALCIUM 9.1  MG 2.2    CBC Recent Labs  Lab 05/15/18 0213  WBC 9.2  HGB 14.8  HCT 46.4*   PLT 437*    Coag's Recent Labs  Lab 05/15/18 0213  INR 0.85    Sepsis Markers No results for input(s): LATICACIDVEN, PROCALCITON, O2SATVEN in the last 168 hours.  ABG Recent Labs  Lab 05/15/18 0353  PHART 7.39  PCO2ART 51*  PO2ART 83    Liver Enzymes Recent Labs  Lab 05/15/18 0213  AST 49*  ALT 60*  ALKPHOS 96  BILITOT 0.4  ALBUMIN 4.6    Cardiac Enzymes Recent Labs  Lab 05/15/18 0213  TROPONINI 0.49*    Glucose No results for input(s): GLUCAP in the last 168 hours.  Imaging Dg Chest Portable 1 View  Result Date: 05/15/2018 CLINICAL DATA:  68 y/o  F; post intubation and OG tube placement. EXAM: PORTABLE CHEST 1 VIEW COMPARISON:  09/30/2017 CT chest FINDINGS: Normal cardiac silhouette. Aortic atherosclerosis with calcification. Hyperinflation of the lungs and emphysema. No focal consolidation. No pleural effusion or pneumothorax. No acute osseous  abnormality is evident. Endotracheal tube tip projects 4.3 cm above the carina. Enteric tube extends below the field of view into the abdomen. IMPRESSION: Endotracheal tube tip projects 4.3 cm above the carina. Enteric tube extends below the field of view into the abdomen. Emphysema. Aortic Atherosclerosis (ICD10-I70.0). No acute pulmonary process identified. Electronically Signed   By: Mitzi HansenLance  Furusawa-Stratton M.D.   On: 05/15/2018 03:35   STUDIES:  2D echo pending  CULTURES: Influenza PCR Respiratory culture  ANTIBIOTICS: Azithromycin  SIGNIFICANT EVENTS: 05/15/18 20: Admitted  LINES/TUBES: ET tube, Foley catheter, peripheral IVs  DISCUSSION: 68 year old female admitted with acute hypoxic and hypercarbic respiratory failure requiring mechanical ventilation  ASSESSMENT / PLAN:  PULMONARY A: Acute hypoxic and hypercarbic respiratory failure History of emphysema P:   Full vent support with current settings Chest x-ray and ABG post intubation reviewed Nebulized bronchodilators and inhaled steroids IV  steroids Fentanyl and propofol for vent sedation discomfort Chest x-ray and ABG PRN Weaning trials as tolerated Antibiotics as above Respiratory culture Rapid influenza  Best Practice: Code Status: Full code Diet: N.p.o. GI prophylaxis: Protonix 40 g IV daily VTE prophylaxis: Subcu heparin  FAMILY  - Updates: Patient's husband updated on current treatment plan at bedside  Charis Juliana S. [facility] ANP-BC Pulmonary and Critical Care Medicine [facility] Pager [phone] or [phone]  NB: This document was prepared using Dragon voice recognition software and may include unintentional dictation errors.    05/15/2018, 5:58 AM

## 2018-05-15 NOTE — ED Triage Notes (Addendum)
Pt arrived from home via EMS with c/o difficulty breathing. Pt has a hx of emphysema. Was seen by urgent care Saturday and was prescribed prednisone, doxy, and albuterol. Pt reports worsening sob and was needing to use her husband O2 at home. Sat 94% on 2.5L upon EMS arrival. Pt alert but appears anxious. Obvious increased work of breathing noted at this time. Pt reports slight improvement after breathing tx.

## 2018-05-15 NOTE — Progress Notes (Addendum)
Advanced Care Plan.  Purpose of Encounter: CODE STATUS. Parties in Attendance: The patient and me. Patient's Decisional Capacity: Yes. Medical Story: Audrey Ochoa is an 68 y.o. female with history of COPD and emphysema.  She is admitted to ICU for acute respiratory failure with hypoxia and hypercapnia due to COPD exacerbation.  She was intubated and extubated this morning.  Now she is alert, awake and oriented.  I discussed with the patient about her current condition, prognosis and CODE STATUS.  The patient stated that she wants to be resuscitated and intubated if she has cardiopulmonary arrest and if she recover with good quality of life.  I explained to her that it is not possible to predict whether she would have good quality of life if she has cardiopulmonary arrest. Plan:  Code Status: Full code. Time spent discussing advance care planning: 18 minutes.

## 2018-05-15 NOTE — ED Provider Notes (Signed)
[facility] Emergency Department Provider Note  ____________________________________________   First MD Initiated Contact with Patient 05/15/18 0203     (approximate)  I have reviewed the triage vital signs and the nursing notes.   HISTORY  Chief Complaint Shortness of Breath  Level 5 caveat:  history/ROS limited by acute/critical illness  HPI Audrey Ochoa is a 68 y.o. female with a history of emphysema who presents by EMS with severe respiratory distress.  She is only able to speak a few words at a time, but reportedly she began to feel short of breath relatively gradually over the course of Friday (2 to 3 days ago).  The following day she went to a doctor and started taking doxycycline and prednisone but she has not improved.  She was feeling considerably worse over the course of yesterday and last night and then had to call EMS due to an inability to breathe.  She has been using her husband's oxygen and has been using 2.5 L but is not feeling any better.  She was tripoding for EMS the entire way into the emergency department.  Prior to arrival she received Solu-Medrol 125 mg IV, 1 DuoNeb, and 1 albuterol breathing treatment.  She acknowledges that she feels a very little bit better but is still  in severe respiratory distress.  She denies chest pain, fever, chills, nausea, vomiting, and abdominal pain.  Past Medical History:  Diagnosis Date  . Emphysema of lung [facility])     Patient Active Problem List   Diagnosis Date Noted  . Acute respiratory failure with hypoxemia ([facility]) 05/15/2018    Past Surgical History:  Procedure Laterality Date  . none      Prior to Admission medications   Not on File    Allergies Penicillins and Sulfa antibiotics  History reviewed. No pertinent family history.  Social History Social History   Tobacco Use  . Smoking status: Current Every Day Smoker    Packs/day: 0.75    Years: 48.00    Pack years: 36.00   Types: Cigarettes  . Smokeless tobacco: Never Used  Substance Use Topics  . Alcohol use: Yes  . Drug use: Not on file    Review of Systems Level 5 caveat:  history/ROS limited by acute/critical illness  ____________________________________________   PHYSICAL EXAM:  ED Triage Vitals  Enc Vitals Group     BP 05/15/18 0208 (!) 185/103     Pulse Rate 05/15/18 0208 95     Resp 05/15/18 0208 (!) 28     Temp --      Temp src --      SpO2 05/15/18 0208 98 %     Weight 05/15/18 0229 47.2 kg (104 lb)     Height 05/15/18 0229 1.575 m (_0 )     Head Circumference --      Peak Flow --      Pain Score 05/15/18 0229 0     Pain Loc --      Pain Edu? --      Excl. in [place]? --      Constitutional: Alert and oriented.  Severe respiratory distress. Eyes: Conjunctivae are normal.  Head: Atraumatic. Nose: No congestion/rhinnorhea. Mouth/Throat: Mucous membranes are dry. Neck: No stridor.  No meningeal signs.   Cardiovascular: Borderline tachycardia, regular rhythm. Good peripheral circulation. Grossly normal heart sounds. Respiratory: Severe respiratory distress, tripoding, decreased air movement throughout, wheezing that is relatively quiet due to decreased air movement. Gastrointestinal: Thin, emphysematous body  habitus.  Soft and nontender. No distention.  Musculoskeletal: No lower extremity tenderness nor edema. No gross deformities of extremities. Neurologic:  No gross focal neurologic deficits are appreciated.  Skin:  Skin is warm, dry and intact. No rash noted.   ____________________________________________   LABS (all labs ordered are listed, but only abnormal results are displayed)  Labs Reviewed  BLOOD GAS, VENOUS - Abnormal; Notable for the following components:      Result Value   pH, Ven 7.22 (*)    pCO2, Ven 89 (*)    pO2, Ven 99.0 (*)    Bicarbonate 36.4 (*)    Acid-Base Excess 5.4 (*)    All other components within normal limits  CBC WITH DIFFERENTIAL/PLATELET  - Abnormal; Notable for the following components:   HCT 46.4 (*)    MCV 102.2 (*)    Platelets 437 (*)    Monocytes Absolute 1.4 (*)    All other components within normal limits  COMPREHENSIVE METABOLIC PANEL - Abnormal; Notable for the following components:   Chloride 97 (*)    CO2 33 (*)    Glucose, Bld 119 (*)    AST 49 (*)    ALT 60 (*)    All other components within normal limits  TROPONIN I - Abnormal; Notable for the following components:   Troponin I 0.49 (*)    All other components within normal limits  BRAIN NATRIURETIC PEPTIDE - Abnormal; Notable for the following components:   B Natriuretic Peptide 182.0 (*)    All other components within normal limits  BLOOD GAS, ARTERIAL - Abnormal; Notable for the following components:   pCO2 arterial 51 (*)    Bicarbonate 30.9 (*)    Acid-Base Excess 4.7 (*)    All other components within normal limits  TROPONIN I - Abnormal; Notable for the following components:   Troponin I 1.01 (*)    All other components within normal limits  MRSA PCR SCREENING  CULTURE, RESPIRATORY  MAGNESIUM  PROTIME-INR  TSH  INFLUENZA PANEL BY PCR (TYPE A & B)  TROPONIN I  TROPONIN I   ____________________________________________  EKG  ED ECG REPORT I, Hinda Kehr, the attending physician, personally viewed and interpreted this ECG.  Date: 05/15/2018 EKG Time: 3:24 AM Rate: 89 Rhythm: normal sinus rhythm QRS Axis: normal Intervals: normal ST/T Wave abnormalities: Non-specific ST segment / T-wave changes, but no clear evidence of acute ischemia. Narrative Interpretation: no definitive evidence of acute ischemia; does not meet STEMI criteria.   ____________________________________________  RADIOLOGY I, Hinda Kehr, personally viewed and evaluated these images (plain radiographs) as part of my medical decision making, as well as reviewing the written report by the radiologist.  ED MD interpretation: Appropriately placed endotracheal  tube.  No other acute abnormalities on chest x-ray.  Enteric tube is appropriately placed as well.  Official radiology report(s): Dg Chest Portable 1 View  Result Date: 05/15/2018 CLINICAL DATA:  68 y/o  F; post intubation and OG tube placement. EXAM: PORTABLE CHEST 1 VIEW COMPARISON:  09/30/2017 CT chest FINDINGS: Normal cardiac silhouette. Aortic atherosclerosis with calcification. Hyperinflation of the lungs and emphysema. No focal consolidation. No pleural effusion or pneumothorax. No acute osseous abnormality is evident. Endotracheal tube tip projects 4.3 cm above the carina. Enteric tube extends below the field of view into the abdomen. IMPRESSION: Endotracheal tube tip projects 4.3 cm above the carina. Enteric tube extends below the field of view into the abdomen. Emphysema. Aortic Atherosclerosis (ICD10-I70.0). No acute pulmonary process identified.  Electronically Signed   By: Kristine Garbe M.D.   On: 05/15/2018 03:35   Dg Abd Portable 1v  Result Date: 05/15/2018 CLINICAL DATA:  OG tube placement EXAM: PORTABLE ABDOMEN - 1 VIEW COMPARISON:  Chest radiograph-earlier same date FINDINGS: Enteric tube tip and side port project over the mid body of the stomach. Limited visualization of lower thorax demonstrates lung hyperexpansion with blunting the bilateral costophrenic angles. No discrete focal airspace opacities. Limited visualization of the upper abdomen demonstrates paucity of bowel gas. No pneumoperitoneum. No pneumatosis or portal venous gas Scoliotic curvature of the thoracolumbar spine with dominant caudal component convex the right, potentially positional. IMPRESSION: Enteric tube tip and side port project over the mid body of the stomach. Electronically Signed   By: Sandi Mariscal M.D.   On: 05/15/2018 07:56    ____________________________________________   PROCEDURES  Critical Care performed: Yes, see critical care procedure note(s)   Procedure(s) performed:   Procedure  Name: Intubation Date/Time: 05/15/2018 3:04 AM Performed by: Hinda Kehr, MD Pre-anesthesia Checklist: Patient identified, Emergency Drugs available, Suction available and Patient being monitored Preoxygenation: Pre-oxygenation with 100% oxygen Induction Type: IV induction and Rapid sequence Laryngoscope Size: Glidescope Tube size: 7.0 mm Number of attempts: 1 Placement Confirmation: ETT inserted through vocal cords under direct vision,  CO2 detector and Breath sounds checked- equal and bilateral Dental Injury: Teeth and Oropharynx as per pre-operative assessment     .Critical Care Performed by: Hinda Kehr, MD Authorized by: Hinda Kehr, MD   Critical care provider statement:    Critical care time (minutes):  60   Critical care time was exclusive of:  Separately billable procedures and treating other patients   Critical care was necessary to treat or prevent imminent or life-threatening deterioration of the following conditions:  Respiratory failure and toxidrome   Critical care was time spent personally by me on the following activities:  Development of treatment plan with patient or surrogate, discussions with consultants, evaluation of patient's response to treatment, examination of patient, obtaining history from patient or surrogate, ordering and performing treatments and interventions, ordering and review of laboratory studies, ordering and review of radiographic studies, pulse oximetry, re-evaluation of patient's condition and review of old charts     ____________________________________________   INITIAL IMPRESSION / [place] / ED COURSE  As part of my medical decision making, I reviewed the following data within the [place] notes reviewed and incorporated, Labs reviewed , EKG interpreted , Old chart reviewed, Radiograph reviewed , Discussed with admitting physician  and Notes from prior ED visits    Differential diagnosis  includes, but is not limited to, COPD exacerbation, pneumothorax, PE, pneumonia.  The patient's presentation is strongly suggestive of a COPD exacerbation but a very severe one.  She is in extremitas at this point and I have called for immediate emergent BiPAP.  If she does not turn around quickly on the BiPAP she will require intubation.  She is still conscious and alert and able to make her own decisions and when I explained this to her she understood the necessity and the gravity of the situation.  She has already received Solu-Medrol, DuoNeb, and albuterol prior to arrival and I am giving her another 2 DuoNeb's and will continue giving her inline albuterol with the BiPAP.  I am also giving her 2 g of magnesium.  We will monitor carefully.    Clinical Course as of May 15 830  Mon May 15, 2018  0233  Patient has been on BiPAP now and is received maximal medical therapy but is not improving significantly.  She can still not breathe well enough to speak in full sentences, is still tripoding, and still using accessory muscles.  I am awaiting the results of the VBG and she does states she feels a little bit better, but I this point I believe it most likely will be necessary to intubate her because she is obviously tiring out rapidly and is not ventilating well in spite of the BiPAP.   [CF]  8937 Patient intubated successfully and without complication.  Of note, her troponin of 0.49 came back as I was preparing for intubation.  She once again confirmed that she is not having any chest pain.   [CF]  0300 Awaiting chest x-ray for confirmation.   [CF]  [phone] Of note, I discussed the elevated troponin with Dr. Marcille Blanco with hospitalist service when we discussed admission.  We agreed that this is most likely secondary to demand ischemia given the patient's symptoms are all respiratory and have been gradually worsening over the last couple days and that she has no EKG changes consistent with acute ischemia.  We  will not treat aggressively as an an STEMI at this time and he will cycle troponins.   [CF]    Clinical Course User Index [CF] Hinda Kehr, MD    (Note that documentation was delayed due to multiple ED patients requiring immediate care.)  Of note, I reviewed all the patient's lab work, and of particular concern was her VBG that demonstrated a PCO2 of 89.  Her comprehensive metabolic panel is generally reassuring.  Her troponin was 0.49 do likely to demand ischemia as described above.  ____________________________________________  FINAL CLINICAL IMPRESSION(S) / ED DIAGNOSES  Final diagnoses:  Acute respiratory failure with hypoxia and hypercapnia (HCC)  COPD exacerbation (HCC)  Demand ischemia (HCC)  Elevated troponin I level     MEDICATIONS GIVEN DURING THIS VISIT:  Medications  fentaNYL 2510mg in NS 2537m(1025mml) infusion-PREMIX (150 mcg/hr Intravenous Rate/Dose Verify 05/15/18 0511)  propofol (DIPRIVAN) 1000 MG/100ML infusion (30 mcg/kg/min  47.2 kg Intravenous Rate/Dose Verify 05/15/18 0511)  enoxaparin (LOVENOX) injection 40 mg (has no administration in time range)  acetaminophen (TYLENOL) tablet 650 mg (has no administration in time range)    Or  acetaminophen (TYLENOL) suppository 650 mg (has no administration in time range)  ondansetron (ZOFRAN) tablet 4 mg (has no administration in time range)    Or  ondansetron (ZOFRAN) injection 4 mg (has no administration in time range)  budesonide (PULMICORT) nebulizer solution 0.25 mg (has no administration in time range)  ipratropium-albuterol (DUONEB) 0.5-2.5 (3) MG/3ML nebulizer solution 3 mL (3 mLs Nebulization Given 05/15/18 0747)  methylPREDNISolone sodium succinate (SOLU-MEDROL) 125 mg/2 mL injection 125 mg (125 mg Intravenous Given 05/15/18 0610)    Followed by  methylPREDNISolone sodium succinate (SOLU-MEDROL) 125 mg/2 mL injection 60 mg (has no administration in time range)  azithromycin (ZITHROMAX) 500 mg in sodium  chloride 0.9 % 250 mL IVPB (has no administration in time range)  nicotine (NICODERM CQ - dosed in mg/24 hours) patch 21 mg (0 mg Transdermal Hold 05/15/18 0611)  chlorhexidine gluconate (MEDLINE KIT) (PERIDEX) 0.12 % solution 15 mL (has no administration in time range)  MEDLINE mouth rinse (has no administration in time range)  ipratropium-albuterol (DUONEB) 0.5-2.5 (3) MG/3ML nebulizer solution 3 mL (3 mLs Nebulization Given 05/15/18 0217)  ipratropium-albuterol (DUONEB) 0.5-2.5 (3) MG/3ML nebulizer solution 3 mL (3 mLs Nebulization Given  05/15/18 0216)  magnesium sulfate IVPB 2 g 50 mL (0 g Intravenous Stopped 05/15/18 0320)  albuterol (PROVENTIL) (2.5 MG/3ML) 0.083% nebulizer solution 7.5 mg (7.5 mg Nebulization Given 05/15/18 0239)  ketamine (KETALAR) injection 90 mg (90 mg Intravenous Given 05/15/18 0254)  rocuronium (ZEMURON) injection 70 mg (70 mg Intravenous Given 05/15/18 0255)  sodium chloride 0.9 % bolus 1,000 mL (0 mLs Intravenous Stopping Infusion hung by another clincian 05/15/18 0511)     ED Discharge Orders    None       Note:  This document was prepared using Dragon voice recognition software and may include unintentional dictation errors.    Hinda Kehr, MD 05/15/18 [phone]

## 2018-05-15 NOTE — ED Provider Notes (Addendum)
   [facility] Emergency Department Provider Note  ____________________________________________   First MD Initiated Contact with Patient 05/15/18 0203     (approximate)  I have reviewed the triage vital signs and the nursing notes.   HISTORY  Chief Complaint Shortness of Breath     Procedure(s) performed: yes  OG placement Date/Time: 05/15/2018 3:12 AM Performed by: Darel Hong, MD Authorized by: Darel Hong, MD  Consent: The procedure was performed in an emergent situation. Patient tolerance: Patient tolerated the procedure well with no immediate complications Comments: Nursing was unable to place OG tube after intubation so they asked me for help.  I used a MAC 4 blade inserted into the patient's mouth and was able to easily pass the OG tube to 70 cm.     Critical Care performed: no  ____________________________________________   INITIAL IMPRESSION / ASSESSMENT AND PLAN / ED COURSE  Pertinent labs & imaging results that were available during my care of the patient were reviewed by me and considered in my medical decision making (see chart for details).   As part of my medical decision making, I reviewed the following data within the [place] History obtained from family if available, nursing notes, old chart and ekg, as well as notes from prior ED visits.    Clinical Course as of May 15 1018  Mon May 15, 2018  0233 Patient has been on BiPAP now and is received maximal medical therapy but is not improving significantly.  She can still not breathe well enough to speak in full sentences, is still tripoding, and still using accessory muscles.  I am awaiting the results of the VBG and she does states she feels a little bit better, but I this point I believe it most likely will be necessary to intubate her because she is obviously tiring out rapidly and is not ventilating well in spite of the BiPAP.   [CF]  7494 Patient  intubated successfully and without complication.  Of note, her troponin of 0.49 came back as I was preparing for intubation.  She once again confirmed that she is not having any chest pain.   [CF]  0300 Awaiting chest x-ray for confirmation.   [CF]  [phone] Of note, I discussed the elevated troponin with Dr. Marcille Blanco with hospitalist service when we discussed admission.  We agreed that this is most likely secondary to demand ischemia given the patient's symptoms are all respiratory and have been gradually worsening over the last couple days and that she has no EKG changes consistent with acute ischemia.  We will not treat aggressively as an an STEMI at this time and he will cycle troponins.   [CF]    Clinical Course User Index [CF] Hinda Kehr, MD     ____________________________________________   FINAL CLINICAL IMPRESSION(S) / ED DIAGNOSES  Final diagnoses:  None      NEW MEDICATIONS STARTED DURING THIS VISIT:  New Prescriptions   No medications on file     Note:  This document was prepared using Dragon voice recognition software and may include unintentional dictation errors.     Darel Hong, MD 05/15/18 5916    Darel Hong, MD 05/15/18 1020

## 2018-05-15 NOTE — Progress Notes (Signed)
The patient was extubated this morning.  She is placed on oxygen by nasal cannula 4 L. Vital signs and lab reviewed.  Physical examinations done.  1.  Respiratory failure: Acute; with hypoxia and hypercapnia.   Continue steroids and nebulizer.  Continue oxygen by nasal cannula.  2.  Emphysema: Continue macrolide for anti-inflammatory effect.  IV steroids in addition to inhaled corticosteroid. 3.  Elevated troponin: Likely secondary to demand ischemia.

## 2018-05-15 NOTE — H&P (Addendum)
Audrey GoldsConstance H Ochoa is an 68 y.o. female.   Chief Complaint: Shortness of breath HPI: The patient with past medical history of COPD presents to the emergency department complaining of shortness of breath.  The patient was prescribed doxycycline and prednisone at urgent care 2 days ago but has become progressively more dyspneic.  Her husband has an oxygen tank prescribed to himself but was allowing her to use supplemental oxygen due to shortness of breath.  She is also been using her nebulizer for breathing treatments that only provide relief briefly.  In the emergency department the patient was given multiple duo nebs as well as Solu-Medrol.  She was placed on BiPAP but continued to have increased work of breathing which prompted intubation and mechanical ventilation.  Once stabilized emergency department staff call hospitalist service for admission.  Past Medical History:  Diagnosis Date  . Emphysema of lung [facility](HCC)     Past Surgical History:  Procedure Laterality Date  . none      History reviewed. No pertinent family history. Unknown by husband  Social History:  reports that she has been smoking cigarettes. She has a 36.00 pack-year smoking history. She has never used smokeless tobacco. She reports current alcohol use. No history on file for drug.  Allergies:  Allergies  Allergen Reactions  . Penicillins   . Sulfa Antibiotics     No medications prior to admission.    Results for orders placed or performed during the hospital encounter of 05/15/18 (from the past 48 hour(s))  Blood gas, venous     Status: Abnormal   Collection Time: 05/15/18  2:13 AM  Result Value Ref Range   FIO2 30.00    Delivery systems BILEVEL POSITIVE AIRWAY PRESSURE    pH, Ven 7.22 (L) 7.250 - 7.430   pCO2, Ven 89 (HH) 44.0 - 60.0 mmHg    Comment: CRITICAL RESULT, NOTIFIED PHYSICIAN DR [facility] [id number] 0230 DT    pO2, Ven 99.0 (H) 32.0 - 45.0 mmHg   Bicarbonate 36.4 (H) 20.0 - 28.0 mmol/L   Acid-Base  Excess 5.4 (H) 0.0 - 2.0 mmol/L   O2 Saturation 96.3 %   Patient temperature 37.0    Collection site LINE    Sample type VENOUS    Mechanical Rate 20     Comment: Performed at [facility], [address] [place], [place] [address]  CBC with Differential/Platelet     Status: Abnormal   Collection Time: 05/15/18  2:13 AM  Result Value Ref Range   WBC 9.2 4.0 - 10.5 K/uL   RBC 4.54 3.87 - 5.11 MIL/uL   Hemoglobin 14.8 12.0 - 15.0 g/dL   HCT 11.946.4 (H) 14.736.0 - 82.946.0 %   MCV 102.2 (H) 80.0 - 100.0 fL   MCH 32.6 26.0 - 34.0 pg   MCHC 31.9 30.0 - 36.0 g/dL   RDW 56.213.0 13.011.5 - 86.515.5 %   Platelets 437 (H) 150 - 400 K/uL   nRBC 0.0 0.0 - 0.2 %   Neutrophils Relative % 59 %   Neutro Abs 5.4 1.7 - 7.7 K/uL   Lymphocytes Relative 26 %   Lymphs Abs 2.4 0.7 - 4.0 K/uL   Monocytes Relative 15 %   Monocytes Absolute 1.4 (H) 0.1 - 1.0 K/uL   Eosinophils Relative 0 %   Eosinophils Absolute 0.0 0.0 - 0.5 K/uL   Basophils Relative 0 %   Basophils Absolute 0.0 0.0 - 0.1 K/uL   Immature Granulocytes 0 %   Abs Immature Granulocytes  0.02 0.00 - 0.07 K/uL    Comment: Performed at [facility], [address] [place], [place] [address]  Comprehensive metabolic panel     Status: Abnormal   Collection Time: 05/15/18  2:13 AM  Result Value Ref Range   Sodium 137 135 - 145 mmol/L   Potassium 4.1 3.5 - 5.1 mmol/L   Chloride 97 (L) 98 - 111 mmol/L   CO2 33 (H) 22 - 32 mmol/L   Glucose, Bld 119 (H) 70 - 99 mg/dL   BUN 11 8 - 23 mg/dL   Creatinine, Ser 7.34 0.44 - 1.00 mg/dL   Calcium 9.1 8.9 - 03.7 mg/dL   Total Protein 7.7 6.5 - 8.1 g/dL   Albumin 4.6 3.5 - 5.0 g/dL   AST 49 (H) 15 - 41 U/L   ALT 60 (H) 0 - 44 U/L   Alkaline Phosphatase 96 38 - 126 U/L   Total Bilirubin 0.4 0.3 - 1.2 mg/dL   GFR calc non Af Amer >60 >60 mL/min   GFR calc Af Amer >60 >60 mL/min   Anion gap 7 5 - 15    Comment: Performed at [facility], [address] [place], [place] [address]   Troponin I - ONCE - STAT     Status: Abnormal   Collection Time: 05/15/18  2:13 AM  Result Value Ref Range   Troponin I 0.49 (HH) <0.03 ng/mL    Comment: CRITICAL RESULT CALLED TO, READ BACK BY AND VERIFIED WITH ANDREA BRYANT ON 05/15/18 AT 0240 BY JAG Performed at [facility], [address] [place], [place] [address]   Magnesium     Status: None   Collection Time: 05/15/18  2:13 AM  Result Value Ref Range   Magnesium 2.2 1.7 - 2.4 mg/dL    Comment: Performed at [facility], [address] [place], [place] [address]  Brain natriuretic peptide     Status: Abnormal   Collection Time: 05/15/18  2:13 AM  Result Value Ref Range   B Natriuretic Peptide 182.0 (H) 0.0 - 100.0 pg/mL    Comment: Performed at [facility], [address] [place], [place] [address]  Protime-INR     Status: None   Collection Time: 05/15/18  2:13 AM  Result Value Ref Range   Prothrombin Time 11.5 11.4 - 15.2 seconds   INR 0.85     Comment: Performed at [facility], [address] [place], [place] [address]  Blood gas, arterial     Status: Abnormal   Collection Time: 05/15/18  3:53 AM  Result Value Ref Range   FIO2 0.40    Delivery systems VENTILATOR    Mode ASSIST CONTROL    VT 400 mL   Peep/cpap 5.0 cm H20   pH, Arterial 7.39 7.350 - 7.450   pCO2 arterial 51 (H) 32.0 - 48.0 mmHg   pO2, Arterial 83 83.0 - 108.0 mmHg   Bicarbonate 30.9 (H) 20.0 - 28.0 mmol/L   Acid-Base Excess 4.7 (H) 0.0 - 2.0 mmol/L   O2 Saturation 96.0 %   Patient temperature 37.0    Collection site LEFT RADIAL    Sample type ARTERIAL DRAW    Allens test (pass/fail) PASS PASS   Mechanical Rate 22     Comment: Performed at [facility], [address], [place], [place] [address]   Dg Chest Portable 1 View  Result Date: 05/15/2018 CLINICAL DATA:  68 y/o  F; post intubation and OG tube  placement. EXAM: PORTABLE CHEST 1 VIEW COMPARISON:  09/30/2017 CT chest FINDINGS: Normal  cardiac silhouette. Aortic atherosclerosis with calcification. Hyperinflation of the lungs and emphysema. No focal consolidation. No pleural effusion or pneumothorax. No acute osseous abnormality is evident. Endotracheal tube tip projects 4.3 cm above the carina. Enteric tube extends below the field of view into the abdomen. IMPRESSION: Endotracheal tube tip projects 4.3 cm above the carina. Enteric tube extends below the field of view into the abdomen. Emphysema. Aortic Atherosclerosis (ICD10-I70.0). No acute pulmonary process identified. Electronically Signed   By: Mitzi HansenLance  Furusawa-Stratton M.D.   On: 05/15/2018 03:35    Review of Systems  Unable to perform ROS: Intubated    Blood pressure 114/73, pulse 67, temperature (!) 97.5 F (36.4 C), temperature source Axillary, resp. rate (!) 22, height 5\' 2"  (1.575 m), weight 46.1 kg, SpO2 100 %. Physical Exam  Vitals reviewed. Constitutional: She appears well-developed and well-nourished. No distress. She is intubated.  HENT:  Head: Normocephalic and atraumatic.  Mouth/Throat: Oropharynx is clear and moist.  Eyes: Pupils are equal, round, and reactive to light. Conjunctivae and EOM are normal. No scleral icterus.  Neck: Normal range of motion. Neck supple. No JVD present. No tracheal deviation present. No thyromegaly present.  Cardiovascular: Normal rate, regular rhythm and normal heart sounds. Exam reveals no gallop and no friction rub.  No murmur heard. Respiratory: Effort normal. She is intubated. She has wheezes in the right upper field, the right middle field and the right lower field.  GI: Soft. Bowel sounds are normal. She exhibits no distension. There is no abdominal tenderness.  Genitourinary:    Genitourinary Comments: Deferred   Lymphadenopathy:    She has no cervical adenopathy.  Neurological: No cranial nerve deficit. She exhibits normal muscle tone.  Currently intubated and sedated  Skin: Skin is warm and dry.  Psychiatric:  MMSE  normal prior to intubation     Assessment/Plan This is a 68 year old female admitted for acute respiratory failure. 1.  Respiratory failure: Acute; with hypoxia and hypercapnia.  Continue steroids as well as breathing treatments as the patient has active wheezing in her right lung fields.  Supplemental oxygen to maintain saturations 88 to 92%.  Wean from mechanical ventilation as tolerated. 2.  Emphysema: Continue macrolide for anti-inflammatory effect.  IV steroids in addition to inhaled corticosteroid. 3.  Elevated troponin: Likely secondary to demand ischemia.  Continue to follow cardiac biomarkers and monitor telemetry. 4.  DVT prophylaxis: Lovenox 5.  GI prophylaxis: PPI following intubation 24 hours The patient is a full code.  I have personally spent 45 minutes in critical care time with this patient.  Discussed with E-Link telemedicine  Arnaldo Nataliamond,  Iriel Nason S, MD 05/15/2018, 6:30 AM

## 2018-05-15 NOTE — ED Notes (Signed)
RT at the bedside to place pt on bipap

## 2018-05-15 NOTE — Progress Notes (Signed)
Patient successfully intubated by Dr York Cerise after brief trial of bipap therapy and several svn txs in line. Patient continued to c/o difficulty breathing with no relief despite our efforts. Placed patient on bipap settings 12/5 r 20 40. BBS noted to be diminished with expiratory wheezes. vbg obtained by RN. MD made decision to intubate.

## 2018-05-15 NOTE — Progress Notes (Signed)
eLink Physician-Brief Progress Note Patient Name: Audrey Ochoa DOB: 01-13-1951 MRN: [id number]   Date of Service  05/15/2018  HPI/Events of Note  68 yo female admitted to ICU intubated and mechanically ventilated. No H&P and incomplete ED documentation in Epic. PCCM asked to assume care. VSS.   eICU Interventions  No new orders.      Intervention Category Evaluation Type: New Patient Evaluation  Lenell Antu 05/15/2018, 5:27 AM

## 2018-05-15 NOTE — Progress Notes (Addendum)
Patient was extubated to nasal cannula per Dr. Belia Heman order. No stridor present. Patient placed on 4L nasal cannula. Patient's oxygen saturation is 92% on 4L nasal cannula.

## 2018-05-16 ENCOUNTER — Inpatient Hospital Stay (HOSPITAL_COMMUNITY)
Admit: 2018-05-16 | Discharge: 2018-05-16 | Disposition: A | Discharge status: Home / Self Care | Payer: Medicare Other | Attending: Adult Health | Admitting: Adult Health

## 2018-05-16 DIAGNOSIS — I340 Nonrheumatic mitral (valve) insufficiency: Secondary | ICD-10-CM

## 2018-05-16 LAB — ECHOCARDIOGRAM COMPLETE
Height: 62 in
Weight: 1626.11 oz

## 2018-05-16 LAB — BASIC METABOLIC PANEL
Anion gap: 5 (ref 5–15)
BUN: 12 mg/dL (ref 8–23)
CHLORIDE: 94 mmol/L — AB (ref 98–111)
CO2: 36 mmol/L — ABNORMAL HIGH (ref 22–32)
Calcium: 9 mg/dL (ref 8.9–10.3)
Creatinine, Ser: 0.55 mg/dL (ref 0.44–1.00)
GFR calc Af Amer: >60 mL/min (ref >60)
GFR calc non Af Amer: >60 mL/min (ref >60)
Glucose, Bld: 121 mg/dL — ABNORMAL HIGH (ref 70–99)
Potassium: 4.6 mmol/L (ref 3.5–5.1)
Sodium: 135 mmol/L (ref 135–145)

## 2018-05-16 MED ORDER — METHYLPREDNISOLONE SODIUM SUCC 40 MG IJ SOLR
20.0000 mg | Freq: Two times a day (BID) | INTRAMUSCULAR | Status: DC
  Administered 2018-05-16 – 2018-05-18 (×4): 20 mg via INTRAVENOUS
  Filled 2018-05-16 (×4): qty 1

## 2018-05-16 MED ORDER — GUAIFENESIN ER 600 MG PO TB12
600.0000 mg | ORAL_TABLET | Freq: Two times a day (BID) | ORAL | Status: DC
  Administered 2018-05-16 – 2018-05-17 (×2): 600 mg via ORAL
  Filled 2018-05-16 (×2): qty 1

## 2018-05-16 MED ORDER — ENOXAPARIN SODIUM 40 MG/0.4ML ~~LOC~~ SOLN
40.0000 mg | SUBCUTANEOUS | Status: DC
  Administered 2018-05-17 – 2018-05-18 (×2): 40 mg via SUBCUTANEOUS
  Filled 2018-05-16 (×4): qty 0.4

## 2018-05-16 MED ORDER — BUDESONIDE 0.5 MG/2ML IN SUSP
0.5000 mg | Freq: Two times a day (BID) | RESPIRATORY_TRACT | Status: DC
  Administered 2018-05-16 – 2018-05-18 (×4): 0.5 mg via RESPIRATORY_TRACT
  Filled 2018-05-16 (×4): qty 2

## 2018-05-16 MED ORDER — NICOTINE 14 MG/24HR TD PT24
14.0000 mg | MEDICATED_PATCH | Freq: Every day | TRANSDERMAL | Status: DC
  Administered 2018-05-17 – 2018-05-21 (×5): 14 mg via TRANSDERMAL
  Filled 2018-05-16 (×5): qty 1

## 2018-05-16 MED ORDER — IPRATROPIUM-ALBUTEROL 0.5-2.5 (3) MG/3ML IN SOLN
3.0000 mL | Freq: Four times a day (QID) | RESPIRATORY_TRACT | Status: DC
  Administered 2018-05-17 (×2): 3 mL via RESPIRATORY_TRACT
  Filled 2018-05-16 (×3): qty 3

## 2018-05-16 MED ORDER — IPRATROPIUM-ALBUTEROL 0.5-2.5 (3) MG/3ML IN SOLN
3.0000 mL | RESPIRATORY_TRACT | Status: DC
  Administered 2018-05-16: 3 mL via RESPIRATORY_TRACT
  Filled 2018-05-16: qty 3

## 2018-05-16 NOTE — Progress Notes (Addendum)
[facility] - [place] [facility]   PATIENT NAME: Audrey Ochoa    MR#:  [id number]  DATE OF BIRTH:  [birth year]  SUBJECTIVE:  CHIEF COMPLAINT:   Chief Complaint  Patient presents with  . Shortness of Breath   The patient still has cough and shortness of breath, on oxygen by nasal cannula 2 L. REVIEW OF SYSTEMS:  Review of Systems  Constitutional: Negative for chills, fever and malaise/fatigue.  HENT: Negative for sore throat.   Eyes: Negative for blurred vision and double vision.  Respiratory: Positive for cough, shortness of breath and wheezing. Negative for hemoptysis, sputum production and stridor.   Cardiovascular: Negative for chest pain, palpitations, orthopnea and leg swelling.  Gastrointestinal: Negative for abdominal pain, blood in stool, diarrhea, melena, nausea and vomiting.  Genitourinary: Negative for dysuria, flank pain and hematuria.  Musculoskeletal: Negative for back pain and joint pain.  Skin: Negative for rash.  Neurological: Negative for dizziness, sensory change, focal weakness, seizures, loss of consciousness, weakness and headaches.  Endo/Heme/Allergies: Negative for polydipsia.  Psychiatric/Behavioral: Negative for depression. The patient is not nervous/anxious.     DRUG ALLERGIES:   Allergies  Allergen Reactions  . Penicillins   . Sulfa Antibiotics    VITALS:  Blood pressure 138/83, pulse 91, temperature 98.4 F (36.9 C), temperature source Oral, resp. rate 16, height 5\' 2"  (1.575 m), weight 46.1 kg, SpO2 96 %. PHYSICAL EXAMINATION:  Physical Exam Constitutional:      General: She is not in acute distress.    Comments: Severe malnutrition.  HENT:     Head: Normocephalic.     Mouth/Throat:     Mouth: Mucous membranes are moist.  Eyes:     General: No scleral icterus.    Conjunctiva/sclera: Conjunctivae normal.     Pupils: Pupils are equal, round, and reactive to light.  Neck:     Musculoskeletal: Normal range of  motion and neck supple.     Vascular: No JVD.     Trachea: No tracheal deviation.  Cardiovascular:     Rate and Rhythm: Normal rate and regular rhythm.     Heart sounds: Normal heart sounds. No murmur. No gallop.   Pulmonary:     Effort: Pulmonary effort is normal. No respiratory distress.     Breath sounds: No stridor. Wheezing present. No rhonchi or rales.  Abdominal:     General: Bowel sounds are normal. There is no distension.     Palpations: Abdomen is soft.     Tenderness: There is no abdominal tenderness. There is no rebound.  Musculoskeletal: Normal range of motion.        General: No tenderness.     Right lower leg: No edema.     Left lower leg: No edema.  Skin:    Findings: No erythema or rash.  Neurological:     General: No focal deficit present.     Mental Status: She is alert and oriented to person, place, and time.     Cranial Nerves: No cranial nerve deficit.  Psychiatric:        Mood and Affect: Mood normal.    LABORATORY PANEL:  Female CBC Recent Labs  Lab 05/15/18 0213  WBC 9.2  HGB 14.8  HCT 46.4*  PLT 437*   ------------------------------------------------------------------------------------------------------------------ Chemistries  Recent Labs  Lab 05/15/18 0213 05/16/18 0225  NA 137 135  K 4.1 4.6  CL 97* 94*  CO2 33* 36*  GLUCOSE 119* 121*  BUN 11 12  CREATININE 0.49 0.55  CALCIUM 9.1 9.0  MG 2.2  --   AST 49*  --   ALT 60*  --   ALKPHOS 96  --   BILITOT 0.4  --    RADIOLOGY:  No results found. ASSESSMENT AND PLAN:    1.    Acute respiratory failure with hypoxia and hypercapnia.   Continue steroids and nebulizer.  Try to wean off oxygen by nasal cannula.  2.  Emphysema: Continue Zithromax, IV steroids in addition to inhaled corticosteroid. 3.  Elevated troponin: Likely secondary to demand ischemia.   Severe malnutrition.  Follow dietitian's recommendation.  Encourage oral intake.  Tobacco abuse.  Smoking cessation was  counseled for 3 to 4 minutes.  Nicotine patch.   Discussed with Dr. Belia Heman. All the records are reviewed and case discussed with Care Management/Social Worker. Management plans discussed with the patient, her husband and daughter and they are in agreement.  CODE STATUS: Full Code  TOTAL TIME TAKING CARE OF THIS PATIENT: 30 minutes.   More than 50% of the time was spent in counseling/coordination of care: YES  POSSIBLE D/C IN 2 DAYS, DEPENDING ON CLINICAL CONDITION.   Shaune Pollack M.D on 05/16/2018 at 2:41 PM  Between 7am to 6pm - Pager - [phone]  After 6pm go to [email or web address] - Therapist, nutritional Hospitalists

## 2018-05-16 NOTE — Discharge Instructions (Signed)
Patient Discharge   Audrey Ochoa / [id number] DOB: 09/01/1950   Admitted 05/15/2018 Discharged: 05/16/2018    Scheduled Meds:  budesonide (PULMICORT) nebulizer solution  0.5 mg Nebulization Q12H   enoxaparin (LOVENOX) injection  40 mg Subcutaneous Q24H   ipratropium-albuterol  3 mL Nebulization Q4H   methylPREDNISolone (SOLU-MEDROL) injection  40 mg Intravenous Q12H   nicotine  7 mg Transdermal Daily   Continuous Infusions:  azithromycin     PRN Meds:acetaminophen **OR** acetaminophen, ipratropium-albuterol, ondansetron **OR** ondansetron (ZOFRAN) IV Personal Items   Please collect all clothing which belongs to you from your nurse. Please collect any valuables you stored during your stay from the front desk, and please remember all of your personal items, such as dentures, canes, and eyeglasses.   Activity Instructions   You must avoid lifting more than *** pounds until your physician instructs you differently. You should avoid {d/c avoid/resume:120111}. You may resume {d/c avoid/resume:120111}.   I understand that if any problems occur once I am at home I am to contact my physician.  I understand and acknowledge receipt of the instructions indicated above.    _____________________________________________                                                       Physician's or R.N.'s Signature                Date/Time                        _____________________________________________                                                       Patient or Representative Signature         Date/Time        I understand that if any problems occur once I am at home I am to contact my physician.  I understand and acknowledge receipt of the instructions indicated above.    _____________________________________________                                                       Physician's or R.N.'s Signature                Date/Time                          _____________________________________________                                                       Patient or Representative Signature         Date/Time        Activity Instructions   You must avoid lifting more than *** pounds until your physician instructs you differently. You should avoid {d/c avoid/resume:120111}. You may resume {  d/c avoid/resume:120111}.

## 2018-05-16 NOTE — Consult Note (Signed)
CRITICAL CARE NOTE  CC  Follow up resp failure  SUBJECTIVE COPD exacerbation Extubated yesterday Lethargic but arousable this AM      SIGNIFICANT EVENTS 1/12 intubated 1/13 extubated    BP 117/70   Pulse 73   Temp 98.1 F (36.7 C)   Resp 13   Ht 5\' 2"  (1.575 m)   Wt 46.1 kg   SpO2 95%   BMI 18.59 kg/m    Review of Systems:  Gen:  Denies  fever, sweats, chills weigh loss  HEENT: Denies blurred vision, double vision, ear pain, eye pain, hearing loss, nose bleeds, sore throat Cardiac:  No dizziness, chest pain or heaviness, chest tightness,edema, No JVD Resp:   No cough, -sputum production, -shortness of breath,-wheezing, -hemoptysis,  Other:  All other systems negative   Physical Examination:   GENERAL:NAD, no fevers, chills, no weakness no fatigue HEAD: Normocephalic, atraumatic.  EYES: Pupils equal, round, reactive to light. Extraocular muscles intact. No scleral icterus.  MOUTH: Moist mucosal membrane. Dentition intact. No abscess noted.  EAR, NOSE, THROAT: Clear without exudates. No external lesions.  NECK: Supple. No thyromegaly. No nodules. No JVD.  PULMONARY: CTA B/L no wheezing, rhonchi, crackles CARDIOVASCULAR: S1 and S2. Regular rate and rhythm. No murmurs, rubs, or gallops. No edema. Pedal pulses 2+ bilaterally.  GASTROINTESTINAL: Soft, nontender, nondistended. No masses. Positive bowel sounds. No hepatosplenomegaly.  MUSCULOSKELETAL: No swelling, clubbing, or edema. Range of motion full in all extremities.  NEUROLOGIC: Cranial nerves II through XII are intact. No gross focal neurological deficits. Sensation intact. Reflexes intact.  ALL OTHER ROS ARE NEGATIVE    ASSESSMENT AND PLAN SYNOPSIS  Severe Hypoxic and Hypercapnic Respiratory Failure-slowly resolving   COPD exacerbation from viral bronchitis Extubated  SEVERE COPD EXACERBATION-resolving slowly -continue IV steroids as prescribed -continue NEB THERAPY as prescribed -morphine as  needed -wean fio2 as needed and tolerated   INFECTIOUS DISEASE -continue antibiotics as prescribed -follow up cultures  Advance diet as tolerated  Ok to transfer to gen med floor   Raynald Rouillard Santiago Glad, M.D.  [facility]  Medical Director [facility] Medical Director [facility] Cardio-Pulmonary Department

## 2018-05-16 NOTE — Plan of Care (Signed)
  Problem: Education: Goal: Knowledge of General Education information will improve Description Including pain rating scale, medication(s)/side effects and non-pharmacologic comfort measures Outcome: Progressing   Problem: Clinical Measurements: Goal: Ability to maintain clinical measurements within normal limits will improve Outcome: Progressing Goal: Will remain free from infection Outcome: Progressing Goal: Diagnostic test results will improve Outcome: Progressing Goal: Respiratory complications will improve Outcome: Progressing Goal: Cardiovascular complication will be avoided Outcome: Progressing   Problem: Coping: Goal: Level of anxiety will decrease 05/16/2018 0450 by Arrie Eastern, RN Outcome: Not Progressing 05/16/2018 0450 by Arrie Eastern, RN Outcome: Progressing   Problem: Pain Managment: Goal: General experience of comfort will improve Outcome: Progressing   Problem: Skin Integrity: Goal: Risk for impaired skin integrity will decrease Outcome: Progressing

## 2018-05-16 NOTE — Progress Notes (Signed)
*  PRELIMINARY RESULTS* Echocardiogram 2D Echocardiogram has been performed.  Audrey Ochoa 05/16/2018, 12:44 PM

## 2018-05-17 ENCOUNTER — Other Ambulatory Visit: Payer: Self-pay

## 2018-05-17 LAB — CBC
HCT: 44.5 % (ref 36.0–46.0)
Hemoglobin: 14.3 g/dL (ref 12.0–15.0)
MCH: 33 pg (ref 26.0–34.0)
MCHC: 32.1 g/dL (ref 30.0–36.0)
MCV: 102.8 fL — ABNORMAL HIGH (ref 80.0–100.0)
PLATELETS: 447 10*3/uL — AB (ref 150–400)
RBC: 4.33 MIL/uL (ref 3.87–5.11)
RDW: 12.7 % (ref 11.5–15.5)
WBC: 10.9 10*3/uL — ABNORMAL HIGH (ref 4.0–10.5)
nRBC: 0 % (ref 0.0–0.2)

## 2018-05-17 LAB — CULTURE, RESPIRATORY W GRAM STAIN

## 2018-05-17 LAB — BASIC METABOLIC PANEL
Anion gap: 8 (ref 5–15)
BUN: 15 mg/dL (ref 8–23)
CO2: 33 mmol/L — ABNORMAL HIGH (ref 22–32)
Calcium: 9 mg/dL (ref 8.9–10.3)
Chloride: 95 mmol/L — ABNORMAL LOW (ref 98–111)
Creatinine, Ser: 0.57 mg/dL (ref 0.44–1.00)
GFR calc Af Amer: >60 mL/min (ref >60)
GFR calc non Af Amer: >60 mL/min (ref >60)
Glucose, Bld: 119 mg/dL — ABNORMAL HIGH (ref 70–99)
Potassium: 4.3 mmol/L (ref 3.5–5.1)
Sodium: 136 mmol/L (ref 135–145)

## 2018-05-17 MED ORDER — AZITHROMYCIN 500 MG PO TABS
250.0000 mg | ORAL_TABLET | Freq: Every day | ORAL | Status: DC
  Filled 2018-05-17: qty 1

## 2018-05-17 MED ORDER — GUAIFENESIN-DM 100-10 MG/5ML PO SYRP
5.0000 mL | ORAL_SOLUTION | ORAL | Status: DC | PRN
  Filled 2018-05-17: qty 5

## 2018-05-17 MED ORDER — IPRATROPIUM-ALBUTEROL 0.5-2.5 (3) MG/3ML IN SOLN
3.0000 mL | RESPIRATORY_TRACT | Status: DC
  Administered 2018-05-17 – 2018-05-19 (×12): 3 mL via RESPIRATORY_TRACT
  Filled 2018-05-17 (×13): qty 3

## 2018-05-17 NOTE — Progress Notes (Signed)
Care of patient taken over from [place], RN at 1500.  Patient resting comfortably in bed with NAD.  Orson Ape, BSN

## 2018-05-17 NOTE — Progress Notes (Signed)
Sent patient to Room 124 per order of med surg status. Report given to [facility]. Admission completed.

## 2018-05-17 NOTE — Progress Notes (Signed)
[facility]     Assessment and Plan:  Acute COPD exacerbation with acute bronchitis. - Appears to be doing better, but still fairly short of breath. -Continue steroids, nebulizers. - Advance activity as tolerated.  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  Nicotine abuse. - Severe emphysema at baseline. - Discussed the importance of smoke cessation, she thinks that she is ready to quit. - We will need outpatient follow-up with pulmonary in 2 to 4 weeks, will need outpatient PFT when recovered, and may benefit from a pulmonary rehab referral. - Patient develops recurrent exacerbations or admissions for COPD exacerbation would consider nocturnal BiPAP.     Date: 05/17/2018  MRN# [id number] Audrey Ochoa 04-Dec-1950   Audrey Ochoa is a 68 y.o. old female seen in follow up for chief complaint of  Chief Complaint  Patient presents with  . Shortness of Breath     HPI:  The patient is a 68 year old female smoker with a history of COPD.  She presented to the ED on 05/15/2018 early a.m. with difficulty breathing, she had previously been seen by urgent care and was prescribed prednisone, doxycycline, albuterol but symptoms continue to decline.  She arrived in respiratory distress to the ED, initially placed on BiPAP with continued respiratory distress, she was subsequently intubated in the ED.  Transferred to the ICU she was treated aggressively for COPD exacerbation with IV steroids, nebs, subsequently extubated on the same day with nasal cannula. Subsequently transferred to the general medical floor.  She is currently on azithromycin, nebulized Pulmicort and duo nebs, Solu-Medrol 20 mg every 12, nicotine patch. Patient notes that she is fairly active at baseline, she had been doing well until this recent episode.  She smokes about 5 cigarettes/day.  She thinks she is going to be able to quit after this episode.  Review  of lab studies shows elevated CO2 levels on presentation of 36.  Troponin negative.  Sputum culture pending.  MRSA PCR negative.  Flu PCR negative, review of ABG shows pH 7.39/51/80 3/30.9.  **Chest x-ray 05/15/2018>> imaging personally reviewed, lungs are severely hyperinflated consistent with severe emphysema. **Lung cancer screening low-dose CT chest 09/30/2017>> severe apical emphysema with hyperinflation.  Continued annual screening was recommended.  Medication:    Current Facility-Administered Medications:  .  acetaminophen (TYLENOL) tablet 650 mg, 650 mg, Oral, Q6H PRN, 650 mg at 05/16/18 1457 **OR** acetaminophen (TYLENOL) suppository 650 mg, 650 mg, Rectal, Q6H PRN, Arnaldo Natal, MD .  azithromycin (ZITHROMAX) 500 mg in sodium chloride 0.9 % 250 mL IVPB, 500 mg, Intravenous, Q24H, Kasa, Kurian, MD, Last Rate: 250 mL/hr at 05/16/18 1102, 500 mg at 05/16/18 1102 .  budesonide (PULMICORT) nebulizer solution 0.5 mg, 0.5 mg, Nebulization, BID, Kasa, Kurian, MD, 0.5 mg at 05/17/18 0806 .  enoxaparin (LOVENOX) injection 40 mg, 40 mg, Subcutaneous, Q24H, Bertram Savin, RPH .  guaiFENesin (MUCINEX) 12 hr tablet 600 mg, 600 mg, Oral, BID, Harlon Ditty D, NP, 600 mg at 05/16/18 2124 .  ipratropium-albuterol (DUONEB) 0.5-2.5 (3) MG/3ML nebulizer solution 3 mL, 3 mL, Nebulization, Q6H PRN, Eugenie Norrie, NP, 3 mL at 05/17/18 0520 .  ipratropium-albuterol (DUONEB) 0.5-2.5 (3) MG/3ML nebulizer solution 3 mL, 3 mL, Nebulization, Q6H, Shaune Pollack, MD, 3 mL at 05/17/18 0806 .  [COMPLETED] methylPREDNISolone sodium succinate (SOLU-MEDROL) 125 mg/2 mL injection 125 mg, 125 mg, Intravenous, Once, 125 mg at 05/15/18 0610 **FOLLOWED BY** methylPREDNISolone sodium succinate (SOLU-MEDROL) 40 mg/mL injection 20  mg, 20 mg, Intravenous, Q12H, Erin FullingKasa, Kurian, MD, 20 mg at 05/17/18 0639 .  nicotine (NICODERM CQ - dosed in mg/24 hours) patch 14 mg, 14 mg, Transdermal, Daily, Kasa, Kurian, MD .  ondansetron  (ZOFRAN) tablet 4 mg, 4 mg, Oral, Q6H PRN **OR** ondansetron (ZOFRAN) injection 4 mg, 4 mg, Intravenous, Q6H PRN, Arnaldo Nataliamond, Michael S, MD, 4 mg at 05/15/18 1603   Allergies:  Penicillins and Sulfa antibiotics      LABORATORY PANEL:   CBC Recent Labs  Lab 05/17/18 0412  WBC 10.9*  HGB 14.3  HCT 44.5  PLT 447*   ------------------------------------------------------------------------------------------------------------------  Chemistries  Recent Labs  Lab 05/15/18 0213  05/17/18 0412  NA 137   < > 136  K 4.1   < > 4.3  CL 97*   < > 95*  CO2 33*   < > 33*  GLUCOSE 119*   < > 119*  BUN 11   < > 15  CREATININE 0.49   < > 0.57  CALCIUM 9.1   < > 9.0  MG 2.2  --   --   AST 49*  --   --   ALT 60*  --   --   ALKPHOS 96  --   --   BILITOT 0.4  --   --    < > = values in this interval not displayed.   ------------------------------------------------------------------------------------------------------------------  Cardiac Enzymes Recent Labs  Lab 05/15/18 1606  TROPONINI 0.28*   ------------------------------------------------------------  RADIOLOGY:   No results found for this or any previous visit. No results found for this or any previous visit. ------------------------------------------------------------------------------------------------------------------  Thank  you for allowing [facility] to assist in the care of your patient. Our recommendations are noted above.  Please contact us if we can be of further service.   Wells Guileseep Harlyn Rathmann, M.D., F.C.C.P.  Board Certified in Internal Medicine, Pulmonary Medicine, Critical Care Medicine, and Sleep Medicine.  [facility] Office Number: [phone]  05/17/2018

## 2018-05-17 NOTE — Progress Notes (Signed)
Pastoral Care Visit per request for Religious Literature    05/17/18 1500  Clinical Encounter Type  Visited With Patient and family together  Visit Type Spiritual support;Initial  Referral From Nurse;Patient  Consult/Referral To Chaplain  Spiritual Encounters  Spiritual Needs Prayer;Sacred text  Stress Factors  Patient Stress Factors Health changes   Chaplain received page requesting copy of Psalm 23 for pt.  Pt shared her concern about her respiratory failure. Shared that she does not believe she has COPD due to only smoking 5 cigarettes a day.  Chaplain listened to pt story, provided Ephriam KnucklesChristian literature including a printout of Ps 23.  Pt was thankful.  Milinda AntisAllen Edouard Gikas, [address]

## 2018-05-17 NOTE — Progress Notes (Signed)
[facility] [place] [facility]   PATIENT NAME: Audrey GalaConstance Ochoa    MR#:  [id number]  DATE OF BIRTH:  02/15/1951  SUBJECTIVE:  CHIEF COMPLAINT:   Chief Complaint  Patient presents with  . Shortness of Breath   The patient still has cough and shortness of breath, on oxygen by nasal cannula 3 L. REVIEW OF SYSTEMS:  Review of Systems  Constitutional: Negative for chills, fever and malaise/fatigue.  HENT: Negative for sore throat.   Eyes: Negative for blurred vision and double vision.  Respiratory: Positive for cough, shortness of breath and wheezing. Negative for hemoptysis, sputum production and stridor.   Cardiovascular: Negative for chest pain, palpitations, orthopnea and leg swelling.  Gastrointestinal: Negative for abdominal pain, blood in stool, diarrhea, melena, nausea and vomiting.  Genitourinary: Negative for dysuria, flank pain and hematuria.  Musculoskeletal: Negative for back pain and joint pain.  Skin: Negative for rash.  Neurological: Negative for dizziness, sensory change, focal weakness, seizures, loss of consciousness, weakness and headaches.  Endo/Heme/Allergies: Negative for polydipsia.  Psychiatric/Behavioral: Negative for depression. The patient is not nervous/anxious.     DRUG ALLERGIES:   Allergies  Allergen Reactions  . Penicillins   . Sulfa Antibiotics    VITALS:  Blood pressure 128/87, pulse 95, temperature 98.5 F (36.9 C), temperature source Oral, resp. rate 18, height 5\' 2"  (1.575 m), weight 46.1 kg, SpO2 94 %. PHYSICAL EXAMINATION:  Physical Exam Constitutional:      General: She is not in acute distress.    Comments: Severe malnutrition.  HENT:     Head: Normocephalic.     Mouth/Throat:     Mouth: Mucous membranes are moist.  Eyes:     General: No scleral icterus.    Conjunctiva/sclera: Conjunctivae normal.     Pupils: Pupils are equal, round, and reactive to light.  Neck:     Musculoskeletal: Normal range of  motion and neck supple.     Vascular: No JVD.     Trachea: No tracheal deviation.  Cardiovascular:     Rate and Rhythm: Normal rate and regular rhythm.     Heart sounds: Normal heart sounds. No murmur. No gallop.   Pulmonary:     Effort: Pulmonary effort is normal. No respiratory distress.     Breath sounds: No stridor. Wheezing present. No rhonchi or rales.  Abdominal:     General: Bowel sounds are normal. There is no distension.     Palpations: Abdomen is soft.     Tenderness: There is no abdominal tenderness. There is no rebound.  Musculoskeletal: Normal range of motion.        General: No tenderness.     Right lower leg: No edema.     Left lower leg: No edema.  Skin:    Findings: No erythema or rash.  Neurological:     General: No focal deficit present.     Mental Status: She is alert and oriented to person, place, and time.     Cranial Nerves: No cranial nerve deficit.  Psychiatric:        Mood and Affect: Mood normal.    LABORATORY PANEL:  Female CBC Recent Labs  Lab 05/17/18 0412  WBC 10.9*  HGB 14.3  HCT 44.5  PLT 447*   ------------------------------------------------------------------------------------------------------------------ Chemistries  Recent Labs  Lab 05/15/18 0213  05/17/18 0412  NA 137   < > 136  K 4.1   < > 4.3  CL 97*   < > 95*  CO2 33*   < > 33*  GLUCOSE 119*   < > 119*  BUN 11   < > 15  CREATININE 0.49   < > 0.57  CALCIUM 9.1   < > 9.0  MG 2.2  --   --   AST 49*  --   --   ALT 60*  --   --   ALKPHOS 96  --   --   BILITOT 0.4  --   --    < > = values in this interval not displayed.   RADIOLOGY:  No results found. ASSESSMENT AND PLAN:    1.    Acute respiratory failure with hypoxia and hypercapnia.   Continue steroids and nebulizer.  Robitussin as needed. Try to wean off oxygen by nasal cannula.  2.  Emphysema: Continue Zithromax, IV steroids in addition to inhaled corticosteroid.  3.  Elevated troponin: Likely secondary to  demand ischemia.   Severe malnutrition.  Follow dietitian's recommendation.  Encourage oral intake.  Tobacco abuse.  Smoking cessation was counseled for 3 to 4 minutes.  Nicotine patch.   Generalized weakness.  Ambulate the patient. All the records are reviewed and case discussed with Care Management/Social Worker. Management plans discussed with the patient, her husband and daughter and they are in agreement.  CODE STATUS: Full Code  TOTAL TIME TAKING CARE OF THIS PATIENT: 25 minutes.   More than 50% of the time was spent in counseling/coordination of care: YES  POSSIBLE D/C IN 2 DAYS, DEPENDING ON CLINICAL CONDITION.   Shaune Pollack M.D on 05/17/2018 at 1:37 PM  Between 7am to 6pm - Pager - [phone]  After 6pm go to [email or web address] - Therapist, nutritional Hospitalists

## 2018-05-18 ENCOUNTER — Inpatient Hospital Stay: Payer: Medicare Other

## 2018-05-18 ENCOUNTER — Encounter: Payer: Self-pay | Admitting: Radiology

## 2018-05-18 MED ORDER — METHYLPREDNISOLONE SODIUM SUCC 40 MG IJ SOLR
40.0000 mg | Freq: Every day | INTRAMUSCULAR | Status: DC
  Administered 2018-05-18 – 2018-05-21 (×4): 40 mg via INTRAVENOUS
  Filled 2018-05-18 (×4): qty 1

## 2018-05-18 MED ORDER — MOMETASONE FURO-FORMOTEROL FUM 200-5 MCG/ACT IN AERO
2.0000 | INHALATION_SPRAY | Freq: Two times a day (BID) | RESPIRATORY_TRACT | Status: DC
  Administered 2018-05-18 – 2018-05-21 (×6): 2 via RESPIRATORY_TRACT
  Filled 2018-05-18: qty 8.8

## 2018-05-18 MED ORDER — METHYLPREDNISOLONE SODIUM SUCC 40 MG IJ SOLR: 40.0000 mg | Freq: Every day | INTRAMUSCULAR | Status: DC

## 2018-05-18 MED ORDER — GUAIFENESIN ER 600 MG PO TB12
600.0000 mg | ORAL_TABLET | Freq: Two times a day (BID) | ORAL | Status: DC
  Administered 2018-05-18 – 2018-05-21 (×7): 600 mg via ORAL
  Filled 2018-05-18 (×7): qty 1

## 2018-05-18 MED ORDER — DOXYCYCLINE HYCLATE 100 MG PO TABS
100.0000 mg | ORAL_TABLET | Freq: Two times a day (BID) | ORAL | Status: DC
  Administered 2018-05-18 – 2018-05-21 (×6): 100 mg via ORAL
  Filled 2018-05-18 (×9): qty 1

## 2018-05-18 MED ORDER — BENZONATATE 100 MG PO CAPS
200.0000 mg | ORAL_CAPSULE | Freq: Three times a day (TID) | ORAL | Status: DC
  Administered 2018-05-18: 22:00:00 200 mg via ORAL
  Filled 2018-05-18 (×4): qty 2

## 2018-05-18 MED ORDER — ALBUTEROL SULFATE (2.5 MG/3ML) 0.083% IN NEBU
2.5000 mg | INHALATION_SOLUTION | RESPIRATORY_TRACT | Status: DC | PRN
  Administered 2018-05-20: 2.5 mg via RESPIRATORY_TRACT
  Filled 2018-05-18: qty 3

## 2018-05-18 MED ORDER — SIMETHICONE 80 MG PO CHEW
80.0000 mg | CHEWABLE_TABLET | Freq: Four times a day (QID) | ORAL | Status: DC | PRN
  Administered 2018-05-20: 80 mg via ORAL
  Filled 2018-05-18 (×3): qty 1

## 2018-05-18 NOTE — Progress Notes (Signed)
[facility] [place] [facility]     Assessment and Plan:  Acute COPD exacerbation with acute bronchitis. - Still has some dyspnea but doing better. -Wean steroids, continue nebulizers. - Advance activity as tolerated.    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  Nicotine abuse. - Severe emphysema at baseline. - Discussed the importance of smoke cessation, she thinks that she is ready to quit. - We will need outpatient follow-up with pulmonary follow-up in 2 to 4 weeks, will need outpatient PFT when recovered and may benefit from a pulmonary rehab referral. - If the patient develops recurrent exacerbations or admissions for COPD exacerbation would consider nocturnal BiPAP.  Pulmonary service will sign off for now.    Date: 05/18/2018  MRN# [id number] Audrey Ochoa [birth year]   Audrey Ochoa is a 68 y.o. old female seen in follow up for chief complaint of  Chief Complaint  Patient presents with  . Shortness of Breath     Subjective.  Patient feels that she is doing significantly better, though she continues to have some dyspnea.  **Chest x-ray 05/15/2018>> imaging personally reviewed, lungs are severely hyperinflated consistent with severe emphysema. **Lung cancer screening low-dose CT chest 09/30/2017>> severe apical emphysema with hyperinflation.  Continued annual screening was recommended.  Medication:    Current Facility-Administered Medications:  .  acetaminophen (TYLENOL) tablet 650 mg, 650 mg, Oral, Q6H PRN, 650 mg at 05/18/18 0959 **OR** acetaminophen (TYLENOL) suppository 650 mg, 650 mg, Rectal, Q6H PRN, Audrey Natal, MD .  albuterol (PROVENTIL) (2.5 MG/3ML) 0.083% nebulizer solution 2.5 mg, 2.5 mg, Nebulization, Q2H PRN, Enid Baas, MD .  benzonatate (TESSALON) capsule 200 mg, 200 mg, Oral, TID, Enid Baas, MD .  doxycycline (VIBRA-TABS) tablet 100 mg, 100 mg, Oral, Q12H, Audrey, Radhika,  MD .  enoxaparin (LOVENOX) injection 40 mg, 40 mg, Subcutaneous, Q24H, Bertram Savin, RPH, 40 mg at 05/17/18 2103 .  guaiFENesin (MUCINEX) 12 hr tablet 600 mg, 600 mg, Oral, BID, Audrey, Radhika, MD .  ipratropium-albuterol (DUONEB) 0.5-2.5 (3) MG/3ML nebulizer solution 3 mL, 3 mL, Nebulization, Q4H, Shaune Pollack, MD, 3 mL at 05/18/18 1144 .  [COMPLETED] methylPREDNISolone sodium succinate (SOLU-MEDROL) 125 mg/2 mL injection 125 mg, 125 mg, Intravenous, Once, 125 mg at 05/15/18 0610 **FOLLOWED BY** methylPREDNISolone sodium succinate (SOLU-MEDROL) 40 mg/mL injection 40 mg, 40 mg, Intravenous, Daily, Audrey, Radhika, MD .  mometasone-formoterol (DULERA) 200-5 MCG/ACT inhaler 2 puff, 2 puff, Inhalation, BID, Audrey Ochoa, Radhika, MD .  nicotine (NICODERM CQ - dosed in mg/24 hours) patch 14 mg, 14 mg, Transdermal, Daily, Kasa, Kurian, MD, 14 mg at 05/18/18 1000 .  ondansetron (ZOFRAN) tablet 4 mg, 4 mg, Oral, Q6H PRN **OR** ondansetron (ZOFRAN) injection 4 mg, 4 mg, Intravenous, Q6H PRN, Audrey Natal, MD, 4 mg at 05/15/18 1603   Allergies:  Penicillins and Sulfa antibiotics   Review of Systems:  Constitutional: Feels well. Cardiovascular: Denies chest pain, exertional chest pain.  Pulmonary: Denies hemoptysis, pleuritic chest pain.   The remainder of systems were reviewed and were found to be negative other than what is documented in the HPI.    Physical Examination:   VS: BP 137/83 (BP Location: Left Arm)   Pulse 91   Temp 98 F (36.7 C) (Oral)   Resp 20   Ht 5\' 2"  (1.575 m)   Wt 50 kg   SpO2 96%   BMI 20.16 kg/m   General Appearance: No distress  Neuro:without focal findings, mental status, speech normal,  alert and oriented HEENT: PERRLA, EOM intact Pulmonary: Scattered bilateral wheezing, improved from yesterday. CardiovascularNormal S1,S2.  No m/r/g.  Abdomen: Benign, Soft, non-tender, No masses Renal:  No costovertebral tenderness  GU:  No performed at this  time. Endoc: No evident thyromegaly, no signs of acromegaly or Cushing features Skin:   warm, no rashes, no ecchymosis  Extremities: normal, no cyanosis, clubbing.     LABORATORY PANEL:   CBC Recent Labs  Lab 05/17/18 0412  WBC 10.9*  HGB 14.3  HCT 44.5  PLT 447*   ------------------------------------------------------------------------------------------------------------------  Chemistries  Recent Labs  Lab 05/15/18 0213  05/17/18 0412  NA 137   < > 136  K 4.1   < > 4.3  CL 97*   < > 95*  CO2 33*   < > 33*  GLUCOSE 119*   < > 119*  BUN 11   < > 15  CREATININE 0.49   < > 0.57  CALCIUM 9.1   < > 9.0  MG 2.2  --   --   AST 49*  --   --   ALT 60*  --   --   ALKPHOS 96  --   --   BILITOT 0.4  --   --    < > = values in this interval not displayed.   ------------------------------------------------------------------------------------------------------------------  Cardiac Enzymes Recent Labs  Lab 05/15/18 1606  TROPONINI 0.28*   ------------------------------------------------------------  RADIOLOGY:   No results found for this or any previous visit. No results found for this or any previous visit. ------------------------------------------------------------------------------------------------------------------  Thank  you for allowing [facility] to assist in the care of your patient. Our recommendations are noted above.  Please contact us if we can be of further service.   Wells Guileseep Jametta Moorehead, M.D., F.C.C.P.  Board Certified in Internal Medicine, Pulmonary Medicine, Critical Care Medicine, and Sleep Medicine.  [facility] Office Number: [phone]  05/18/2018

## 2018-05-18 NOTE — Progress Notes (Signed)
[facility] - [place] at [facility]   PATIENT NAME: Audrey Ochoa    MR#:  [id number]  DATE OF BIRTH:  Jul 28, 1950  SUBJECTIVE:  CHIEF COMPLAINT:   Chief Complaint  Patient presents with  . Shortness of Breath   -Still requiring 3 L oxygen which is acute.  Complains of cough and refuses medication for anxiety  REVIEW OF SYSTEMS:  Review of Systems  Constitutional: Positive for malaise/fatigue. Negative for chills and fever.  HENT: Negative for congestion, ear discharge, hearing loss and nosebleeds.   Eyes: Negative for blurred vision and double vision.  Respiratory: Positive for cough, shortness of breath and wheezing.   Cardiovascular: Negative for chest pain, palpitations and leg swelling.  Gastrointestinal: Negative for abdominal pain, constipation, diarrhea, nausea and vomiting.  Genitourinary: Negative for dysuria.  Musculoskeletal: Negative for myalgias.  Neurological: Negative for dizziness, focal weakness, seizures, weakness and headaches.  Psychiatric/Behavioral: Negative for depression. The patient is nervous/anxious.     DRUG ALLERGIES:   Allergies  Allergen Reactions  . Penicillins   . Sulfa Antibiotics     VITALS:  Blood pressure 137/83, pulse 91, temperature 98 F (36.7 C), temperature source Oral, resp. rate 20, height 5\' 2"  (1.575 m), weight 50 kg, SpO2 96 %.  PHYSICAL EXAMINATION:  Physical Exam   GENERAL:  68 y.o.-year-old thin built, well-nourished patient lying in the bed with no acute distress.  EYES: Pupils equal, round, reactive to light and accommodation. No scleral icterus. Extraocular muscles intact.  HEENT: Head atraumatic, normocephalic. Oropharynx and nasopharynx clear.  NECK:  Supple, no jugular venous distention. No thyroid enlargement, no tenderness.  LUNGS: Very scant breath sounds bilaterally, scattered rhonchi, no wheezing, rales or crepitation. No use of accessory muscles of respiration.  CARDIOVASCULAR: S1, S2  normal. No  rubs, or gallops.  2/6 systolic murmur is present ABDOMEN: Soft, nontender, nondistended. Bowel sounds present. No organomegaly or mass.  EXTREMITIES: No pedal edema, cyanosis, or clubbing.  NEUROLOGIC: Cranial nerves II through XII are intact. Muscle strength 5/5 in all extremities. Sensation intact. Gait not checked.  Global weakness noted PSYCHIATRIC: The patient is alert and oriented x 3.  SKIN: No obvious rash, lesion, or ulcer.    LABORATORY PANEL:   CBC Recent Labs  Lab 05/17/18 0412  WBC 10.9*  HGB 14.3  HCT 44.5  PLT 447*   ------------------------------------------------------------------------------------------------------------------  Chemistries  Recent Labs  Lab 05/15/18 0213  05/17/18 0412  NA 137   < > 136  K 4.1   < > 4.3  CL 97*   < > 95*  CO2 33*   < > 33*  GLUCOSE 119*   < > 119*  BUN 11   < > 15  CREATININE 0.49   < > 0.57  CALCIUM 9.1   < > 9.0  MG 2.2  --   --   AST 49*  --   --   ALT 60*  --   --   ALKPHOS 96  --   --   BILITOT 0.4  --   --    < > = values in this interval not displayed.   ------------------------------------------------------------------------------------------------------------------  Cardiac Enzymes Recent Labs  Lab 05/15/18 1606  TROPONINI 0.28*   ------------------------------------------------------------------------------------------------------------------  RADIOLOGY:  No results found.  EKG:   Orders placed or performed during the hospital encounter of 05/15/18  . EKG 12-Lead  . EKG 12-Lead  . EKG 12-Lead  . EKG 12-Lead    ASSESSMENT AND PLAN:  68 year old female with past medical history significant for emphysema not on home oxygen, ongoing smoking presents to hospital secondary to worsening shortness of breath.  1.  Acute hypoxic respiratory failure-secondary to COPD exacerbation -Patient failed BiPAP and got intubated prior to admission. -Last x-ray on admission showing significant  emphysema no pneumonia.  She is being treated for bronchitis as well. -Continue IV steroids, nebs and inhalers.  Change antibiotics to doxycycline as patient does not want to take oral azithromycin. -Echocardiogram showing EF of 50 to 55% and mild diastolic dysfunction.  Elevated pulmonary artery pressures noted. -Encourage incentive spirometry.  Currently on 3 L oxygen which is acute.  Wean as tolerated.  2.  Tobacco use disorder-started on nicotine patch.  Patient counseled against smoking  3.  DVT prophylaxis-Lovenox  4.  Elevated troponin-likely demand ischemia.  No chest pain, troponin is trending down. -Echo with no wall motion abnormalities  Encourage ambulation   All the records are reviewed and case discussed with Care Management/Social Workerr. Management plans discussed with the patient, family and they are in agreement.  CODE STATUS: Full code  TOTAL TIME TAKING CARE OF THIS PATIENT: 38 minutes.   POSSIBLE D/C IN 2 DAYS, DEPENDING ON CLINICAL CONDITION.   Enid Baas M.D on 05/18/2018 at 1:52 PM  Between 7am to 6pm - Pager - [phone]  After 6pm go to [email or web address] - Social research officer, government  [facility]  Office  [phone]  CC: Primary care physician; Preston Fleeting, MD

## 2018-05-19 DIAGNOSIS — E440 Moderate protein-calorie malnutrition: Secondary | ICD-10-CM

## 2018-05-19 MED ORDER — ENSURE ENLIVE PO LIQD
237.0000 mL | ORAL | Status: DC
  Administered 2018-05-19 – 2018-05-20 (×2): 237 mL via ORAL

## 2018-05-19 MED ORDER — ADULT MULTIVITAMIN W/MINERALS CH
1.0000 | ORAL_TABLET | Freq: Every day | ORAL | Status: DC
  Administered 2018-05-19 – 2018-05-21 (×3): 1 via ORAL
  Filled 2018-05-19 (×3): qty 1

## 2018-05-19 MED ORDER — IPRATROPIUM-ALBUTEROL 0.5-2.5 (3) MG/3ML IN SOLN
3.0000 mL | Freq: Four times a day (QID) | RESPIRATORY_TRACT | Status: DC
  Administered 2018-05-20 – 2018-05-21 (×6): 3 mL via RESPIRATORY_TRACT
  Filled 2018-05-19 (×6): qty 3

## 2018-05-19 NOTE — Plan of Care (Signed)

## 2018-05-19 NOTE — Progress Notes (Signed)
[employer] [facility] - [place] at [facility]   PATIENT NAME: Audrey Ochoa    MR#:  [id number]  DATE OF BIRTH:  Aug 06, 1950  SUBJECTIVE:  CHIEF COMPLAINT:   Chief Complaint  Patient presents with  . Shortness of Breath   -Feels stronger today, still on 2.5L acute o2  REVIEW OF SYSTEMS:  Review of Systems  Constitutional: Positive for malaise/fatigue. Negative for chills and fever.  HENT: Negative for congestion, ear discharge, hearing loss and nosebleeds.   Eyes: Negative for blurred vision and double vision.  Respiratory: Positive for cough, shortness of breath and wheezing.   Cardiovascular: Negative for chest pain, palpitations and leg swelling.  Gastrointestinal: Negative for abdominal pain, constipation, diarrhea, nausea and vomiting.  Genitourinary: Negative for dysuria.  Musculoskeletal: Negative for myalgias.  Neurological: Negative for dizziness, focal weakness, seizures, weakness and headaches.  Psychiatric/Behavioral: Negative for depression. The patient is nervous/anxious.     DRUG ALLERGIES:   Allergies  Allergen Reactions  . Penicillins   . Sulfa Antibiotics     VITALS:  Blood pressure 128/83, pulse 92, temperature 98.1 F (36.7 C), temperature source Oral, resp. rate (!) 22, height 5\' 2"  (1.575 m), weight 50 kg, SpO2 93 %.  PHYSICAL EXAMINATION:  Physical Exam   GENERAL:  68 y.o.-year-old thin built, well-nourished patient lying in the bed with no acute distress.  EYES: Pupils equal, round, reactive to light and accommodation. No scleral icterus. Extraocular muscles intact.  HEENT: Head atraumatic, normocephalic. Oropharynx and nasopharynx clear.  NECK:  Supple, no jugular venous distention. No thyroid enlargement, no tenderness.  LUNGS:  scant breath sounds bilaterally- but moving air today, scattered rhonchi, no wheezing, rales or crepitation. No use of accessory muscles of respiration.  CARDIOVASCULAR: S1, S2 normal. No  rubs, or gallops.   2/6 systolic murmur is present ABDOMEN: Soft, nontender, nondistended. Bowel sounds present. No organomegaly or mass.  EXTREMITIES: No pedal edema, cyanosis, or clubbing.  NEUROLOGIC: Cranial nerves II through XII are intact. Muscle strength 5/5 in all extremities. Sensation intact. Gait not checked.  Global weakness noted PSYCHIATRIC: The patient is alert and oriented x 3. Very talkative, anxious. SKIN: No obvious rash, lesion, or ulcer.    LABORATORY PANEL:   CBC Recent Labs  Lab 05/17/18 0412  WBC 10.9*  HGB 14.3  HCT 44.5  PLT 447*   ------------------------------------------------------------------------------------------------------------------  Chemistries  Recent Labs  Lab 05/15/18 0213  05/17/18 0412  NA 137   < > 136  K 4.1   < > 4.3  CL 97*   < > 95*  CO2 33*   < > 33*  GLUCOSE 119*   < > 119*  BUN 11   < > 15  CREATININE 0.49   < > 0.57  CALCIUM 9.1   < > 9.0  MG 2.2  --   --   AST 49*  --   --   ALT 60*  --   --   ALKPHOS 96  --   --   BILITOT 0.4  --   --    < > = values in this interval not displayed.   ------------------------------------------------------------------------------------------------------------------  Cardiac Enzymes Recent Labs  Lab 05/15/18 1606  TROPONINI 0.28*   ------------------------------------------------------------------------------------------------------------------  RADIOLOGY:  Dg Chest 2 View  Result Date: 05/18/2018 CLINICAL DATA:  COPD exacerbation. Acute respiratory failure with hypoxemia. Extubation. EXAM: CHEST - 2 VIEW COMPARISON:  05/15/2018 and screening CT chest 09/30/2017. FINDINGS: AP SEMI-ERECT and LATERAL images were obtained. Extubation. Nasogastric  tube removal. Cardiac silhouette normal in size for AP technique, unchanged. Thoracic aorta atherosclerotic, unchanged. Hilar and mediastinal contours otherwise unremarkable. Emphysematous changes throughout both lungs with hyperinflation, unchanged. Mild to  moderate central peribronchial thickening and prominent bronchovascular markings diffusely, unchanged. Lungs otherwise clear. No localized airspace consolidation. No pleural effusions. No pneumothorax. Normal pulmonary vascularity. Mild degenerative changes involving the thoracic and lumbar spine. Likely osseous demineralization. IMPRESSION: 1. Extubation.  No acute cardiopulmonary disease. 2. Aortic Atherosclerosis (ICD10-I70.0) and Emphysema (ICD10-J43.9). Electronically Signed   By: Hulan Saashomas  Lawrence M.D.   On: 05/18/2018 14:54    EKG:   Orders placed or performed during the hospital encounter of 05/15/18  . EKG 12-Lead  . EKG 12-Lead  . EKG 12-Lead  . EKG 12-Lead    ASSESSMENT AND PLAN:   68 year old female with past medical history significant for emphysema not on home oxygen, ongoing smoking presents to hospital secondary to worsening shortness of breath.  1.  Acute hypoxic respiratory failure-secondary to COPD exacerbation -Patient failed BiPAP and got intubated on admission. -Last x-ray on admission showing significant emphysema no pneumonia.  She is being treated for bronchitis as well. -receiving IV steroids, nebs and inhalers.   -Change antibiotics to doxycycline as patient does not want to take oral azithromycin. -Echocardiogram showing EF of 50 to 55% and mild diastolic dysfunction.  Elevated pulmonary artery pressures noted. -Encourage incentive spirometry.  Currently on 2-3 L oxygen which is acute.  Wean as tolerated.  2.  Tobacco use disorder-started on nicotine patch.  Patient counseled against smoking  3.  DVT prophylaxis-Lovenox  4.  Elevated troponin-likely demand ischemia.  No chest pain, troponin is trending down. -Echo with no wall motion abnormalities  Encourage ambulation Physical therapy Discharge when able to be weaned off oxygen   All the records are reviewed and case discussed with Care Management/Social Workerr. Management plans discussed with the  patient, family and they are in agreement.  CODE STATUS: Full code  TOTAL TIME TAKING CARE OF THIS PATIENT: 36 minutes.   POSSIBLE D/C IN 1-2 DAYS, DEPENDING ON CLINICAL CONDITION.   Enid Baasadhika Hansen Carino M.D on 05/19/2018 at 12:44 PM  Between 7am to 6pm - Pager - [phone]  After 6pm go to [email or web address] - Social research officer, governmentpassword [email or web address] [email or web address]  [facility] [place] [facility]  Office  [phone]  CC: Primary care physician; Preston Fleetingevelo, Adrian Mancheno, MD

## 2018-05-19 NOTE — Care Management Important Message (Signed)
Important Message  Patient Details  Name: Audrey Ochoa MRN: [id number] Date of Birth: 1950-11-20   Medicare Important Message Given:  Yes    Olegario Messier A Pedrohenrique [place]on[place] 05/19/2018, 11:35 AM

## 2018-05-19 NOTE — Plan of Care (Signed)
Pt ambulated to bathroom few times during the shift.  Pt encouraged to use incentive spirometer qxhr. Currently O2 sats at 92% on 1.5L O2.

## 2018-05-19 NOTE — Evaluation (Signed)
Physical Therapy Evaluation Patient Details Name: Audrey Ochoa MRN: [id number] DOB: Jun 22, 1950 Today's Date: 05/19/2018   History of Present Illness  68 year old female with past medical history significant for emphysema not on home oxygen, ongoing smoking (pt reports minimal smoking?).  She has been short of breath for > 1 week, came in and needed intubation initially.  Now extubated but still feeling poorly.  Clinical Impression  Pt with considerable shortness of breath t/o the PT exam and subsequent gait training.  She reports feeling very weak and did display general U&LE strength deficits as well as unsteadiness with standing/ambulation that is non-congruent with her typical active lifestyle.  She was able to tolerate ~100 ft of ambulation on 3 liters but despite cuing for focused breathing and a few standing rest breaks her sats did drop to the low 80s and she had DOE.  Pt is not very open to the idea of using a cane and was able to do some of the gait training with UE assist.  Pt eager to get home, but knows that he breathing needs to improve considerably before that is safe.      Follow Up Recommendations Home health PT(with likely transition to [facility]-type program)    Equipment Recommendations  None recommended by PT(may need O2, discussed use of SPC if unsteadiness persists)    Recommendations for Other Services       Precautions / Restrictions Precautions Precautions: None Restrictions Weight Bearing Restrictions: No      Mobility  Bed Mobility Overal bed mobility: Independent             General bed mobility comments: Pt easily gets sitting at EOB  Transfers Overall transfer level: Modified independent Equipment used: None             General transfer comment: Pt initially very unsteading in standing up, needed to lean back on bed and/or hold PT hand.  Was able to maintain balance after ~1 minute of standing  Ambulation/Gait Ambulation/Gait  assistance: Supervision Gait Distance (Feet): 100 Feet Assistive device: None;1 person hand held assist       General Gait Details: Pt initially needed HHA or rail to maintain balance.  O2 3 liters t/o the effort with gradual drop in sats from mid 90s to 82% after the effort.  She did have relatively quick recovery regarding O2 (back to ~90% on 2.5 liters within 1 minute of sitting).  She was able to do the last 40 ft w/o UEs but was slow, cautious and generally very far from her baseline.   Stairs            Wheelchair Mobility    Modified Rankin (Stroke Patients Only)       Balance Overall balance assessment: Needs assistance   Sitting balance-Leahy Scale: Normal Sitting balance - Comments: Pt with no issues regarding sitting balance     Standing balance-Leahy Scale: Fair Standing balance comment: Pt with no LOBs, however clearly feeling unsteady and at least initially needed UE holding something to maintain balance (first time up and a few day).  She eventually felt more comfortable and did show increased steadiness, but is not at all near her baseline.                              Pertinent Vitals/Pain Pain Assessment: 0-10 Pain Score: 3     Home Living Family/patient expects to be discharged to:: Private residence Living  Arrangements: Spouse/significant other Available Help at Discharge: Family Type of Home: House Home Access: Stairs to enter Entrance Stairs-Rails: (1 rail at back steps) Secretary/administratorntrance Stairs-Number of Steps: 4 Home Layout: One level Home Equipment: None      Prior Function Level of Independence: Independent         Comments: Pt cares for grand daughter, until recently she was riding bike/hiking multiple times/wk     Hand Dominance        Extremity/Trunk Assessment   Upper Extremity Assessment Upper Extremity Assessment: Generalized weakness(grossly 4-/5)    Lower Extremity Assessment Lower Extremity Assessment:  Generalized weakness(grossly 4-/5)       Communication   Communication: No difficulties(shortness of breath with prolonged speaking)  Cognition Arousal/Alertness: Awake/alert Behavior During Therapy: WFL for tasks assessed/performed Overall Cognitive Status: Within Functional Limits for tasks assessed                                        General Comments      Exercises     Assessment/Plan    PT Assessment Patient needs continued PT services  PT Problem List Decreased range of motion;Decreased strength;Decreased balance;Decreased activity tolerance;Decreased mobility;Decreased coordination       PT Treatment Interventions Gait training;Stair training;Functional mobility training;Therapeutic activities;Therapeutic exercise;Balance training;Neuromuscular re-education;Patient/family education    PT Goals (Current goals can be found in the Care Plan section)  Acute Rehab PT Goals Patient Stated Goal: go home, get breathing better, have husband help more around the home PT Goal Formulation: With patient Time For Goal Achievement: 06/02/18 Potential to Achieve Goals: Good    Frequency Min 2X/week   Barriers to discharge        Co-evaluation               AM-PAC PT "6 Clicks" Mobility  Outcome Measure Help needed turning from your back to your side while in a flat bed without using bedrails?: None Help needed moving from lying on your back to sitting on the side of a flat bed without using bedrails?: None Help needed moving to and from a bed to a chair (including a wheelchair)?: None Help needed standing up from a chair using your arms (e.g., wheelchair or bedside chair)?: None Help needed to walk in hospital room?: A Little Help needed climbing 3-5 steps with a railing? : A Little 6 Click Score: 22    End of Session Equipment Utilized During Treatment: Gait belt Activity Tolerance: Patient tolerated treatment well Patient left: in bed;with call  bell/phone within reach Nurse Communication: (O2 status with activity) PT Visit Diagnosis: Muscle weakness (generalized) (M62.81);Difficulty in walking, not elsewhere classified (R26.2)    Time: [id number] PT Time Calculation (min) (ACUTE ONLY): 29 min   Charges:   PT Evaluation $PT Eval Low Complexity: 1 Low PT Treatments $Gait Training: 8-22 mins        Malachi ProGalen R Latriece Anstine, DPT 05/19/2018, 10:41 AM

## 2018-05-19 NOTE — Progress Notes (Signed)
Initial Nutrition Assessment  DOCUMENTATION CODES:   Non-severe (moderate) malnutrition in context of chronic illness  INTERVENTION:   - MVI with minerals daily  - Ensure Enlive po q 24 hours, each supplement provides 350 kcal and 20 grams of protein (chocolate flavor)  - Provided education regarding the importance of adequate kcal and protein intake in maintaining lean muscle mass and preventing further weight loss  NUTRITION DIAGNOSIS:   Moderate Malnutrition related to chronic illness (emphysema) as evidenced by moderate fat depletion, moderate muscle depletion, severe muscle depletion.  GOAL:   Patient will meet greater than or equal to 90% of their needs  MONITOR:   PO intake, Supplement acceptance, Weight trends  REASON FOR ASSESSMENT:   Consult Assessment of nutrition requirement/status  ASSESSMENT:   68 year old female who presented to the ED on 1/13 with SOB. PMH significant for emphysema. Pt was intubated in the ED and later extubated that same day.  RN in room providing nursing care.  Spoke with pt at bedside who reports having a good appetite and being "ravenous" for breakfast this morning. Pt shares that she was able to eat about half of her meal at breakfast due to "huge portions."  Pt reports that she typically has a good appetite but has noticed that it has decreased over the last few months which she believes is due to hip pain an inactivity.  Pt shares that she follows a "bland diet" and does not eat many animal products or processed foods. Pt states that she does eat 3 meals daily and eats "until I get full." Pt states, "I don't like to overeat."  Breakfast: blueberries with oatmeal, whole wheat toast, and black coffee (1-2 cups) OR 2 oranges and 1/2 piece of peanut butter toast with black coffee Lunch: homemade chicken noodle soup Dinner: chicken or fish with vegetables and rice  Pt shares that she is "a big supplement person." Pt reports taking  niacin to help her skin but then had to stop due to altered lab results. Pt amenable to RD ordering MVI daily.  Pt shares that she has always been active with bike riding and hiking. Pt reports that her UBW is 102 lbs and that this has been her weight since high school. Pt reports that she did lost weight several years ago due to stress. Pt reports her lowest adult weight as 88 lbs. Pt believes that she has recently lost weight down to 99 lbs due to decreased appetite and stress.  Weight history in chart is limited but shows that pt has maintained a fairly stable weight since 2017. However, only 3 weight values charted.  Pt reports typically being regular with BMs, having 2 daily. Pt reports lately she has been constipated and has had "a lot of gas."  Pt is willing to try Ensure Enlive to increase kcal and protein intake. Provided education regarding importance of adequate kcal and protein intake in maintaining lean muscle mass and preventing further weight loss. Pt expresses understanding.  Meal Completion: 50-90% x last 3 meals  Medications and labs reviewed.  NUTRITION - FOCUSED PHYSICAL EXAM:    Most Recent Value  Orbital Region  Moderate depletion  Upper Arm Region  Moderate depletion  Thoracic and Lumbar Region  Moderate depletion  Buccal Region  Moderate depletion  Temple Region  Moderate depletion  Clavicle Bone Region  Severe depletion  Clavicle and Acromion Bone Region  Severe depletion  Scapular Bone Region  Moderate depletion  Dorsal Hand  Severe depletion  Patellar Region  Severe depletion  Anterior Thigh Region  Severe depletion  Posterior Calf Region  Severe depletion  Edema (RD Assessment)  None  Hair  Reviewed  Eyes  Reviewed  Mouth  Reviewed  Skin  Reviewed [scattered ecchymosis]  Nails  Reviewed       Diet Order:   Diet Order            Diet regular Room service appropriate? Yes; Fluid consistency: Thin  Diet effective now              EDUCATION  NEEDS:   Education needs have been addressed  Skin:  Skin Assessment: Reviewed RN Assessment  Last BM:  1/16 (large type 3)  Height:   Ht Readings from Last 1 Encounters:  05/15/18 5\' 2"  (1.575 m)    Weight:   Wt Readings from Last 1 Encounters:  05/18/18 50 kg    Ideal Body Weight:  50 kg  BMI:  Body mass index is 20.16 kg/m.  Estimated Nutritional Needs:   Kcal:  1600-1800  Protein:  75-90 grams  Fluid:  1.6-1.8 L    Earma ReadingKate Jablonski Elleana Stillson, MS, RD, LDN Inpatient Clinical Dietitian Pager: [phone] Weekend/After Hours: [phone]

## 2018-05-20 MED ORDER — ORAL CARE MOUTH RINSE
15.0000 mL | Freq: Two times a day (BID) | OROMUCOSAL | Status: DC
  Administered 2018-05-20: 22:00:00 15 mL via OROMUCOSAL

## 2018-05-20 NOTE — Progress Notes (Signed)
[facility] [employer] - [place] at [facility]   PATIENT NAME: Audrey Ochoa    MR#:  [id number]  DATE OF BIRTH:  07-24-1950  SUBJECTIVE:  CHIEF COMPLAINT:   Chief Complaint  Patient presents with  . Shortness of Breath   -Feels stronger today, still on 2.5L acute o2  REVIEW OF SYSTEMS:  Review of Systems  Constitutional: Positive for malaise/fatigue. Negative for chills and fever.  HENT: Negative for congestion, ear discharge, hearing loss and nosebleeds.   Eyes: Negative for blurred vision and double vision.  Respiratory: Positive for cough, shortness of breath and wheezing.   Cardiovascular: Negative for chest pain, palpitations and leg swelling.  Gastrointestinal: Negative for abdominal pain, constipation, diarrhea, nausea and vomiting.  Genitourinary: Negative for dysuria.  Musculoskeletal: Negative for myalgias.  Neurological: Negative for dizziness, focal weakness, seizures, weakness and headaches.  Psychiatric/Behavioral: Negative for depression. The patient is nervous/anxious.     DRUG ALLERGIES:   Allergies  Allergen Reactions  . Penicillins   . Sulfa Antibiotics     VITALS:  Blood pressure 125/72, pulse 78, temperature 97.8 F (36.6 C), temperature source Oral, resp. rate (!) 23, height 5\' 2"  (1.575 m), weight 44.5 kg, SpO2 95 %.  PHYSICAL EXAMINATION:  Physical Exam   GENERAL:  68 y.o.-year-old thin built, well-nourished patient lying in the bed with no acute distress.  EYES: Pupils equal, round, reactive to light and accommodation. No scleral icterus. Extraocular muscles intact.  HEENT: Head atraumatic, normocephalic. Oropharynx and nasopharynx clear.  NECK:  Supple, no jugular venous distention. No thyroid enlargement, no tenderness.  LUNGS:  scant breath sounds bilaterally- but moving air today, scattered rhonchi, no wheezing, rales or crepitation. No use of accessory muscles of respiration.  CARDIOVASCULAR: S1, S2 normal. No  rubs, or  gallops.  2/6 systolic murmur is present ABDOMEN: Soft, nontender, nondistended. Bowel sounds present. No organomegaly or mass.  EXTREMITIES: No pedal edema, cyanosis, or clubbing.  NEUROLOGIC: Cranial nerves II through XII are intact. Muscle strength 5/5 in all extremities. Sensation intact. Gait not checked.  Global weakness noted PSYCHIATRIC: The patient is alert and oriented x 3. Very talkative, anxious. SKIN: No obvious rash, lesion, or ulcer.    LABORATORY PANEL:   CBC Recent Labs  Lab 05/17/18 0412  WBC 10.9*  HGB 14.3  HCT 44.5  PLT 447*   ------------------------------------------------------------------------------------------------------------------  Chemistries  Recent Labs  Lab 05/15/18 0213  05/17/18 0412  NA 137   < > 136  K 4.1   < > 4.3  CL 97*   < > 95*  CO2 33*   < > 33*  GLUCOSE 119*   < > 119*  BUN 11   < > 15  CREATININE 0.49   < > 0.57  CALCIUM 9.1   < > 9.0  MG 2.2  --   --   AST 49*  --   --   ALT 60*  --   --   ALKPHOS 96  --   --   BILITOT 0.4  --   --    < > = values in this interval not displayed.   ------------------------------------------------------------------------------------------------------------------  Cardiac Enzymes Recent Labs  Lab 05/15/18 1606  TROPONINI 0.28*   ------------------------------------------------------------------------------------------------------------------  RADIOLOGY:  Dg Chest 2 View  Result Date: 05/18/2018 CLINICAL DATA:  COPD exacerbation. Acute respiratory failure with hypoxemia. Extubation. EXAM: CHEST - 2 VIEW COMPARISON:  05/15/2018 and screening CT chest 09/30/2017. FINDINGS: AP SEMI-ERECT and LATERAL images were obtained. Extubation. Nasogastric  tube removal. Cardiac silhouette normal in size for AP technique, unchanged. Thoracic aorta atherosclerotic, unchanged. Hilar and mediastinal contours otherwise unremarkable. Emphysematous changes throughout both lungs with hyperinflation,  unchanged. Mild to moderate central peribronchial thickening and prominent bronchovascular markings diffusely, unchanged. Lungs otherwise clear. No localized airspace consolidation. No pleural effusions. No pneumothorax. Normal pulmonary vascularity. Mild degenerative changes involving the thoracic and lumbar spine. Likely osseous demineralization. IMPRESSION: 1. Extubation.  No acute cardiopulmonary disease. 2. Aortic Atherosclerosis (ICD10-I70.0) and Emphysema (ICD10-J43.9). Electronically Signed   By: Hulan Saas M.D.   On: 05/18/2018 14:54    EKG:   Orders placed or performed during the hospital encounter of 05/15/18  . EKG 12-Lead  . EKG 12-Lead  . EKG 12-Lead  . EKG 12-Lead    ASSESSMENT AND PLAN:   68 year old female with past medical history significant for emphysema not on home oxygen, ongoing smoking presents to hospital secondary to worsening shortness of breath.  1.  Acute hypoxic respiratory failure-secondary to COPD exacerbation -Patient failed BiPAP and got intubated on admission. -Last x-ray on admission showing significant emphysema no pneumonia.  She is being treated for bronchitis as well. -on IV steroids, nebs and inhalers.   -Change antibiotics to doxycycline as patient does not want to take oral azithromycin. -Echocardiogram showing EF of 50 to 55% and mild diastolic dysfunction.  Elevated pulmonary artery pressures noted. -Encourage incentive spirometry.  Currently on 1 L oxygen which is acute.  Wean as tolerated.   2.  Tobacco use disorder-started on nicotine patch.  Patient counseled against smoking  3.  DVT prophylaxis-Lovenox  4.  Elevated troponin-likely demand ischemia.  No chest pain, troponin is trending down. -Echo with no wall motion abnormalities  Encourage ambulation Physical therapy Discharge tomorrow   All the records are reviewed and case discussed with Care Management/Social Workerr. Management plans discussed with the patient, family  and they are in agreement.  CODE STATUS: Full code  TOTAL TIME TAKING CARE OF THIS PATIENT: 36 minutes.   POSSIBLE D/C IN 1-2 DAYS, DEPENDING ON CLINICAL CONDITION.   Enid Baas M.D on 05/20/2018 at 12:13 PM  Between 7am to 6pm - Pager - [phone]  After 6pm go to [email or web address] - Social research officer, government  [facility]  Office  [phone]  CC: Primary care physician; Preston Fleeting, MD

## 2018-05-21 MED ORDER — PREDNISONE 20 MG PO TABS: 40.0000 mg | ORAL_TABLET | Freq: Every day | ORAL | 0 refills | Status: AC

## 2018-05-21 MED ORDER — BUDESONIDE-FORMOTEROL FUMARATE 160-4.5 MCG/ACT IN AERO: 2.0000 | INHALATION_SPRAY | Freq: Two times a day (BID) | RESPIRATORY_TRACT | 12 refills | Status: AC

## 2018-05-21 MED ORDER — IPRATROPIUM-ALBUTEROL 0.5-2.5 (3) MG/3ML IN SOLN: 3.0000 mL | RESPIRATORY_TRACT | 1 refills | Status: AC | PRN

## 2018-05-21 MED ORDER — DOXYCYCLINE HYCLATE 100 MG PO TABS: 100.0000 mg | ORAL_TABLET | Freq: Two times a day (BID) | ORAL | 0 refills | Status: AC

## 2018-05-21 MED ORDER — NICOTINE 14 MG/24HR TD PT24: 14.0000 mg | MEDICATED_PATCH | TRANSDERMAL | 0 refills | Status: AC

## 2018-05-21 NOTE — Progress Notes (Signed)
SATURATION QUALIFICATIONS: (This note is used to comply with regulatory documentation for home oxygen)  Patient Saturations on Room Air at Rest = 89%  Patient Saturations on Room Air while Ambulating = 85%  Patient Saturations on 1.5Liters of oxygen while sitting = 94%  Patient Saturations on 1.5 Liters of oxygen while ambulating= 89%  Please briefly explain why patient needs home oxygen:

## 2018-05-21 NOTE — Progress Notes (Signed)
Pt discharge instructions provided and reviewed. Pt questions and requested actions taken to include obtaining of a dr order for nicotine patch. Pt states she has called pharmacy and prescription is ready. All belongings gathered and taken out with pt. Pt wheeled out in chair. 2 NT assistants Jada W and Debbie W. assisted pt in getting all belongings to vehicle.

## 2018-05-21 NOTE — Discharge Summary (Addendum)
[facility]   PATIENT NAME: Audrey Ochoa    MR#:  [id number]  DATE OF BIRTH:  [birth year]  DATE OF ADMISSION:  05/15/2018   ADMITTING PHYSICIAN: Arnaldo Natal, MD  DATE OF DISCHARGE:  05/21/18  PRIMARY CARE PHYSICIAN: Preston Fleeting, MD   ADMISSION DIAGNOSIS:   Demand ischemia (HCC) [I24.8] COPD exacerbation (HCC) [J44.1] Elevated troponin I level [R79.89] Acute respiratory failure with hypoxia and hypercapnia (HCC) [J96.01, J96.02]  DISCHARGE DIAGNOSIS:   Active Problems:   Acute respiratory failure with hypoxemia (HCC)   Malnutrition of moderate degree   SECONDARY DIAGNOSIS:   Past Medical History:  Diagnosis Date  . Emphysema of lung [employer])     HOSPITAL COURSE:   68 year old female with past medical history significant for emphysema not on home oxygen, ongoing smoking presents to hospital secondary to worsening shortness of breath.  1.  Acute hypoxic respiratory failure-secondary to COPD exacerbation -Patient failed BiPAP and got intubated on admission. - chest x-ray on admission showing significant emphysema no pneumonia.  She is being treated for bronchitis as well. -received IV steroids, nebs and inhalers.  discharge on oral steroids and doxycycline -Echocardiogram showing EF of 50 to 55% and mild diastolic dysfunction.  Elevated pulmonary artery pressures noted. -Encourage incentive spirometry.   -Acutely needing 2 L oxygen in the hospital.  Unable to be weaned off especially on exertion.  Will discharge on home oxygen  2.  Tobacco use disorder- Patient counseled against smoking  3.  Elevated troponin-likely demand ischemia.  No chest pain, troponin is trending down. -Echo with no wall motion abnormalities  Ambulating well.  Will be discharged home today   DISCHARGE CONDITIONS:   Guarded'  CONSULTS OBTAINED:    None  DRUG ALLERGIES:   Allergies  Allergen Reactions  . Penicillins   .  Sulfa Antibiotics    DISCHARGE MEDICATIONS:   Allergies as of 05/21/2018      Reactions   Penicillins    Sulfa Antibiotics       Medication List    STOP taking these medications   benzonatate 200 MG capsule Commonly known as:  TESSALON   doxycycline 100 MG capsule Commonly known as:  VIBRAMYCIN Replaced by:  doxycycline 100 MG tablet   mupirocin ointment 2 % Commonly known as:  BACTROBAN   triamcinolone ointment 0.1 % Commonly known as:  KENALOG     TAKE these medications   acetaminophen 500 MG tablet Commonly known as:  TYLENOL Take 1,000 mg by mouth every 6 (six) hours as needed.   albuterol 108 (90 Base) MCG/ACT inhaler Commonly known as:  PROVENTIL HFA;VENTOLIN HFA Inhale 2 puffs into the lungs every 6 (six) hours as needed for wheezing.   budesonide-formoterol 160-4.5 MCG/ACT inhaler Commonly known as:  SYMBICORT Inhale 2 puffs into the lungs every 12 (twelve) hours.   doxycycline 100 MG tablet Commonly known as:  VIBRA-TABS Take 1 tablet (100 mg total) by mouth every 12 (twelve) hours for 3 days. Replaces:  doxycycline 100 MG capsule   ipratropium-albuterol 0.5-2.5 (3) MG/3ML Soln Commonly known as:  DUONEB Take 3 mLs by nebulization every 4 (four) hours as needed. What changed:  reasons to take this   nicotine 14 mg/24hr patch Commonly known as:  NICODERM CQ - dosed in mg/24 hours Place 1 patch (14 mg total) onto the skin daily for 30 days.   predniSONE 20 MG tablet Commonly known as:  DELTASONE Take 2 tablets (40 mg  total) by mouth daily for 6 days.   SPIRIVA HANDIHALER 18 MCG inhalation capsule Generic drug:  tiotropium Place 1 tablet into inhaler and inhale daily.            Durable Medical Equipment  (From admission, onward)         Start     Ordered   05/21/18 1102  For home use only DME Nebulizer machine  Once    Question:  Patient needs a nebulizer to treat with the following condition  Answer:  COPD (chronic obstructive pulmonary  disease) (HCC)   05/21/18 1102   05/21/18 0000  For home use only DME oxygen    Question Answer Comment  Mode or (Route) Nasal cannula   Liters per Minute 2   Frequency Continuous (stationary and portable oxygen unit needed)   Oxygen conserving device Yes   Oxygen delivery system Gas      05/21/18 1102           DISCHARGE INSTRUCTIONS:   1.  PCP follow-up in 1 to 2 weeks 2.  Pulmonary follow-up in 2 weeks  DIET:   Regular diet  ACTIVITY:   Activity as tolerated  OXYGEN:   Home Oxygen: Yes.    Oxygen Delivery: 2 liters/min via Patient connected to nasal cannula oxygen  DISCHARGE LOCATION:   home   If you experience worsening of your admission symptoms, develop shortness of breath, life threatening emergency, suicidal or homicidal thoughts you must seek medical attention immediately by calling 911 or calling your MD immediately  if symptoms less severe.  You Must read complete instructions/literature along with all the possible adverse reactions/side effects for all the Medicines you take and that have been prescribed to you. Take any new Medicines after you have completely understood and accpet all the possible adverse reactions/side effects.   Please note  You were cared for by a hospitalist during your hospital stay. If you have any questions about your discharge medications or the care you received while you were in the hospital after you are discharged, you can call the unit and asked to speak with the hospitalist on call if the hospitalist that took care of you is not available. Once you are discharged, your primary care physician will handle any further medical issues. Please note that NO REFILLS for any discharge medications will be authorized once you are discharged, as it is imperative that you return to your primary care physician (or establish a relationship with a primary care physician if you do not have one) for your aftercare needs so that they can reassess  your need for medications and monitor your lab values.    On the day of Discharge:  VITAL SIGNS:   Blood pressure (!) 142/78, pulse 78, temperature 98.4 F (36.9 C), temperature source Oral, resp. rate 16, height 5\' 2"  (1.575 m), weight 44.3 kg, SpO2 94 %.  PHYSICAL EXAMINATION:   GENERAL:  68 y.o.-year-old thin built, well-nourished patient lying in the bed with no acute distress.  EYES: Pupils equal, round, reactive to light and accommodation. No scleral icterus. Extraocular muscles intact.  HEENT: Head atraumatic, normocephalic. Oropharynx and nasopharynx clear.  NECK:  Supple, no jugular venous distention. No thyroid enlargement, no tenderness.  LUNGS:  improved breath sounds bilaterally, scattered rhonchi, no wheezing, rales or crepitation. No use of accessory muscles of respiration.  CARDIOVASCULAR: S1, S2 normal. No  rubs, or gallops.  2/6 systolic murmur is present ABDOMEN: Soft, nontender, nondistended. Bowel sounds present. No  organomegaly or mass.  EXTREMITIES: No pedal edema, cyanosis, or clubbing.  NEUROLOGIC: Cranial nerves II through XII are intact. Muscle strength 5/5 in all extremities. Sensation intact. Gait not checked.  Global weakness noted PSYCHIATRIC: The patient is alert and oriented x 3. Very talkative, anxious. SKIN: No obvious rash, lesion, or ulcer.    DATA REVIEW:   CBC Recent Labs  Lab 05/17/18 0412  WBC 10.9*  HGB 14.3  HCT 44.5  PLT 447*    Chemistries  Recent Labs  Lab 05/15/18 0213  05/17/18 0412  NA 137   < > 136  K 4.1   < > 4.3  CL 97*   < > 95*  CO2 33*   < > 33*  GLUCOSE 119*   < > 119*  BUN 11   < > 15  CREATININE 0.49   < > 0.57  CALCIUM 9.1   < > 9.0  MG 2.2  --   --   AST 49*  --   --   ALT 60*  --   --   ALKPHOS 96  --   --   BILITOT 0.4  --   --    < > = values in this interval not displayed.     Microbiology Results  Results for orders placed or performed during the hospital encounter of 05/15/18  MRSA PCR  Screening     Status: None   Collection Time: 05/15/18  5:04 AM  Result Value Ref Range Status   MRSA by PCR NEGATIVE NEGATIVE Final    Comment:        The GeneXpert MRSA Assay (FDA approved for NASAL specimens only), is one component of a comprehensive MRSA colonization surveillance program. It is not intended to diagnose MRSA infection nor to guide or monitor treatment for MRSA infections. Performed at [facility], [address] [place], [place] [address]   Culture, respiratory (non-expectorated)     Status: None   Collection Time: 05/15/18  6:30 AM  Result Value Ref Range Status   Specimen Description   Final    TRACHEAL ASPIRATE Performed at [facility], [address] [place], [place] [address]    Special Requests   Final    NONE Performed at [facility], [address] [place], [place] [address]    Gram Stain   Final    FEW WBC PRESENT, PREDOMINANTLY PMN RARE GRAM POSITIVE COCCI IN PAIRS Performed at [facility], [address] [place], [place] [address]    Culture FEW CANDIDA ALBICANS  Final   Report Status 05/17/2018 FINAL  Final    RADIOLOGY:  No results found.   Management plans discussed with the patient, family and they are in agreement.  CODE STATUS:     Code Status Orders  (From admission, onward)         Start     Ordered   05/15/18 0417  Full code  Continuous     05/15/18 0416        Code Status History    This patient has a current code status but no historical code status.      TOTAL TIME TAKING CARE OF THIS PATIENT: 38 minutes.    Enid Baasadhika Fahim Kats M.D on 05/21/2018 at 11:45 AM  Between 7am to 6pm - Pager - [phone]  After 6pm go to [email or web address] - Scientist, research (life sciences)password EPAS [facility]  [facility]  Office  [phone]  CC: Primary care physician; Neita Goodnightevelo,  Presley Raddle, MD   Note: This dictation was prepared with Dragon dictation along with smaller phrase  technology. Any transcriptional errors that result from this process are unintentional.

## 2018-05-22 ENCOUNTER — Telehealth: Payer: Self-pay | Admitting: *Deleted

## 2018-05-22 NOTE — Telephone Encounter (Signed)
Contacted patient and scheduled appt.

## 2018-05-22 NOTE — Telephone Encounter (Signed)
-----   Message from Shane CrutchPradeep Ramachandran, MD sent at 05/18/2018  3:32 PM EST ----- Regarding: hfu Pt needs hfu in 2-4 weeks.

## 2018-05-23 ENCOUNTER — Telehealth: Payer: Self-pay

## 2018-05-23 NOTE — Telephone Encounter (Signed)
EMMI Follow-up: Ms. Audrey Ochoa had left me a voicemail as she had received several automated calls and checking to why we had called.  I explained our process of two automated calls post discharge to see how she was doing, checking to see if she got her Rx's filled and if she was aware of her follow-up appointments.  Said she got her Rx's filled and changed one appointment to a later date but was aware of the other appointments. Said she was waiting for DME company for her oxygen tanks and was feeling better.  Said she would keep my number on file in case she had any further questions.

## 2018-06-05 ENCOUNTER — Ambulatory Visit: Payer: Medicare Other | Admitting: Internal Medicine

## 2018-06-05 ENCOUNTER — Encounter: Payer: Self-pay | Admitting: Internal Medicine

## 2018-06-05 VITALS — BP 126/70 | HR 57 | Resp 16 | Ht 62.0 in | Wt 99.0 lb

## 2018-06-05 DIAGNOSIS — J449 Chronic obstructive pulmonary disease, unspecified: Secondary | ICD-10-CM

## 2018-06-05 NOTE — Progress Notes (Signed)
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  Nicotine abuse. - Severe emphysema at baseline.Continue symbicort, nebs, spiriva.  - Patient develops recurrent exacerbations or admissions for COPD exacerbation would consider nocturnal BiPAP. --Discussed pulmonary rehab, declined. Currently enrolled in lung cancer screening.  She tells me she is already received pneumonia vaccine and flu shot this season.   Date: 06/05/2018  MRN# [id number] Audrey Ochoa May 31, 1950   Audrey Ochoa is a 68 y.o. old female seen in follow up for chief complaint of  Chief Complaint  Patient presents with  . Hospitalization Follow-up    pt uses 2 liter 02 qhs and with exertion. She is not using 02 today sats 99%.She states breathing is stable.  Marland Kitchen COPD    she states she has been having nose bleeds since using 02.  . Cough    clear mucus with slight yellow tinge. She is using neti-pot.     HPI:   The patient is a 68 year old female smoker with a history of COPD.  She presented to the ED on 05/15/2018 early a.m. with difficulty breathing, she was admitted to the hospital for COPD exacerbation.  She was admitted to the ICU for respiratory distress on BiPAP, ultimately discharged on 1/19.  Currently she feels tha there breathing is feeling better, she has stopped smoking but her anxiety has been bothering her. She has a rescue inhaler but has not had to use it. She is using dulera from the hospitalization instead of her symbicort. Her speech is pressured today, she is upset about the parking situation outside. She is taking spiriva once daily. She uses her nebs about twice daily. She is off prednisone.   **Chest x-ray 05/15/2018>> imaging personally reviewed, lungs are severely hyperinflated consistent with severe emphysema. **Lung cancer screening low-dose CT chest 09/30/2017>> severe apical emphysema with  hyperinflation.  Continued annual screening was recommended.  Medication:    Current Outpatient Medications:  .  acetaminophen (TYLENOL) 500 MG tablet, Take 1,000 mg by mouth every 6 (six) hours as needed., Disp: , Rfl:  .  albuterol (PROVENTIL HFA;VENTOLIN HFA) 108 (90 Base) MCG/ACT inhaler, Inhale 2 puffs into the lungs every 6 (six) hours as needed for wheezing., Disp: , Rfl:  .  budesonide-formoterol (SYMBICORT) 160-4.5 MCG/ACT inhaler, Inhale 2 puffs into the lungs every 12 (twelve) hours., Disp: 1 Inhaler, Rfl: 12 .  ipratropium-albuterol (DUONEB) 0.5-2.5 (3) MG/3ML SOLN, Take 3 mLs by nebulization every 4 (four) hours as needed., Disp: 360 mL, Rfl: 1 .  nicotine (NICODERM CQ - DOSED IN MG/24 HOURS) 14 mg/24hr patch, Place 1 patch (14 mg total) onto the skin daily for 30 days., Disp: 30 patch, Rfl: 0 .  tiotropium (SPIRIVA HANDIHALER) 18 MCG inhalation capsule, Place 1 tablet into inhaler and inhale daily., Disp: , Rfl:    Allergies:  Penicillins and Sulfa antibiotics   Review of Systems:  Constitutional: Feels well. Cardiovascular: Denies chest pain, exertional chest pain.  Pulmonary: Denies hemoptysis, pleuritic chest pain.   The remainder of systems were reviewed and were found to be negative other than what is documented in the HPI.    Physical Examination:   VS: BP 126/70 (BP Location: Left Arm, Cuff Size: Normal)   Pulse (!) 57   Resp 16   Ht 5\' 2"  (1.575 m)   Wt 99 lb (44.9 kg)   SpO2 99%   BMI 18.11 kg/m  General Appearance: No distress  Neuro:without focal findings, mental status, speech normal, alert and oriented HEENT: PERRLA, EOM intact Pulmonary: No wheezing, No rales Decreased air entry bilaterally.  CardiovascularNormal S1,S2.  No m/r/g.  Abdomen: Benign, Soft, non-tender, No masses Renal:  No costovertebral tenderness  GU:  No performed at this time. Endoc: No evident thyromegaly, no signs of acromegaly or Cushing features Skin:   warm, no rashes, no  ecchymosis  Extremities: normal, no cyanosis, clubbing.     LABORATORY PANEL:   CBC No results for input(s): WBC, HGB, HCT, PLT in the last 168 hours. ------------------------------------------------------------------------------------------------------------------  Chemistries  No results for input(s): NA, K, CL, CO2, GLUCOSE, BUN, CREATININE, CALCIUM, MG, AST, ALT, ALKPHOS, BILITOT in the last 168 hours.  Invalid input(s): GFRCGP ------------------------------------------------------------------------------------------------------------------  Cardiac Enzymes No results for input(s): TROPONINI in the last 168 hours. ------------------------------------------------------------  RADIOLOGY:   No results found for this or any previous visit. Results for orders placed during the hospital encounter of 05/15/18  DG Chest 2 View   Narrative CLINICAL DATA:  COPD exacerbation. Acute respiratory failure with hypoxemia. Extubation.  EXAM: CHEST - 2 VIEW  COMPARISON:  05/15/2018 and screening CT chest 09/30/2017.  FINDINGS: AP SEMI-ERECT and LATERAL images were obtained. Extubation. Nasogastric tube removal. Cardiac silhouette normal in size for AP technique, unchanged. Thoracic aorta atherosclerotic, unchanged. Hilar and mediastinal contours otherwise unremarkable. Emphysematous changes throughout both lungs with hyperinflation, unchanged. Mild to moderate central peribronchial thickening and prominent bronchovascular markings diffusely, unchanged. Lungs otherwise clear. No localized airspace consolidation. No pleural effusions. No pneumothorax. Normal pulmonary vascularity. Mild degenerative changes involving the thoracic and lumbar spine. Likely osseous demineralization.  IMPRESSION: 1. Extubation.  No acute cardiopulmonary disease. 2. Aortic Atherosclerosis (ICD10-I70.0) and Emphysema (ICD10-J43.9).   Electronically Signed   By: Hulan Saas M.D.   On: 05/18/2018  14:54    ------------------------------------------------------------------------------------------------------------------  Thank  you for allowing [facility] to assist in the care of your patient. Our recommendations are noted above.  Please contact us if we can be of further service.   Wells Guiles, M.D., F.C.C.P.  Board Certified in Internal Medicine, Pulmonary Medicine, Critical Care Medicine, and Sleep Medicine.  [facility] Office Number: [phone]  06/05/2018

## 2018-06-05 NOTE — Patient Instructions (Signed)
Will send you for a lung function test in 3 months.  Continue your inhalers.

## 2018-06-07 ENCOUNTER — Other Ambulatory Visit: Payer: Self-pay | Admitting: Family Medicine

## 2018-06-07 DIAGNOSIS — Z1231 Encounter for screening mammogram for malignant neoplasm of breast: Secondary | ICD-10-CM

## 2018-06-13 ENCOUNTER — Telehealth: Payer: Self-pay | Admitting: Internal Medicine

## 2018-06-13 NOTE — Telephone Encounter (Signed)
Pt was seen on 06/05/2018 and Pulmonary Rehab was mentioned to patient. At that time patient declined, since then patient is interested in proceeding with Pulmonary Rehab.  Pt will have PFT the last of April and will return to see you on 09/04/2018.    Pt is wanting to proceed with Pulmonary Rehab.  Please advise. Rhonda J Cobb

## 2018-06-14 NOTE — Addendum Note (Signed)
Addended by: Shane Crutch on: 06/14/2018 09:32 AM   Modules accepted: Orders, SmartSet

## 2018-06-14 NOTE — Telephone Encounter (Signed)
Ok, order placed for pulm rehab.

## 2018-06-14 NOTE — Telephone Encounter (Signed)
Called and LMOVM to advised patient that pulmonary rehab referral has been placed. Advised patient that she would received a call from [facility] here at [facility] to schedule. If she had any additional questions, to call me at [phone]. Nothing else needed at this time. Rhonda J Cobb

## 2018-06-20 ENCOUNTER — Ambulatory Visit
Admission: RE | Admit: 2018-06-20 | Discharge: 2018-06-20 | Disposition: A | Discharge status: Home / Self Care | Payer: Medicare Other | Source: Ambulatory Visit | Attending: Family Medicine | Admitting: Family Medicine

## 2018-06-20 DIAGNOSIS — Z1231 Encounter for screening mammogram for malignant neoplasm of breast: Secondary | ICD-10-CM | POA: Insufficient documentation

## 2018-08-29 ENCOUNTER — Ambulatory Visit: Payer: Medicare Other

## 2018-09-04 ENCOUNTER — Ambulatory Visit: Payer: Medicare Other | Admitting: Internal Medicine

## 2018-09-26 ENCOUNTER — Ambulatory Visit: Payer: Medicare Other | Admitting: Internal Medicine

## 2018-10-04 ENCOUNTER — Other Ambulatory Visit: Payer: Self-pay | Admitting: Internal Medicine

## 2018-10-05 ENCOUNTER — Other Ambulatory Visit: Payer: Self-pay

## 2018-10-10 ENCOUNTER — Telehealth: Payer: Self-pay

## 2018-10-10 NOTE — Telephone Encounter (Signed)
LM for patient regarding covid testing for PFT, also need to r/s f/u apt with Ram to after 10/18/18.

## 2018-10-11 NOTE — Telephone Encounter (Signed)
Spoke to patient regarding her PFT, let her know if we r/s it would probably be August. Patient chose to keep 10/18/18 PFT. She is aware of covid testing date and location. I communicated with her that I was unaware if the test would be a cost to her or not. Nothing further needed at this time.

## 2018-10-11 NOTE — Telephone Encounter (Signed)
Pt called back, let her know of PFT scheduled date and attempted to reschedule office visit but pt stated that she would need to reschedule PFT to the following week if possible, move to the week of June 22nd.  Pt also wants to know if COVID testing will be free of cost for her or will she have to pay? she'd rather go somewhere for free if she has to pay for it here.

## 2018-10-12 ENCOUNTER — Ambulatory Visit: Payer: Medicare Other

## 2018-10-13 ENCOUNTER — Other Ambulatory Visit: Payer: Self-pay

## 2018-10-13 ENCOUNTER — Other Ambulatory Visit
Admission: RE | Admit: 2018-10-13 | Discharge: 2018-10-13 | Disposition: A | Discharge status: Home / Self Care | Payer: Medicare Other | Source: Ambulatory Visit | Attending: Internal Medicine | Admitting: Internal Medicine

## 2018-10-13 DIAGNOSIS — Z01812 Encounter for preprocedural laboratory examination: Secondary | ICD-10-CM | POA: Insufficient documentation

## 2018-10-13 DIAGNOSIS — Z1159 Encounter for screening for other viral diseases: Secondary | ICD-10-CM | POA: Insufficient documentation

## 2018-10-14 LAB — NOVEL CORONAVIRUS, NAA (HOSP ORDER, SEND-OUT TO REF LAB; TAT 18-24 HRS): SARS-CoV-2, NAA: NOT DETECTED

## 2018-10-17 ENCOUNTER — Telehealth: Payer: Self-pay | Admitting: *Deleted

## 2018-10-17 ENCOUNTER — Ambulatory Visit: Payer: Medicare Other | Admitting: Internal Medicine

## 2018-10-17 NOTE — Telephone Encounter (Signed)
Left message for patient to notify them that it is time to schedule annual low dose lung cancer screening CT scan. Instructed patient to call back to verify information prior to the scan being scheduled.  

## 2018-10-18 ENCOUNTER — Ambulatory Visit: Payer: Medicare Other

## 2018-10-19 ENCOUNTER — Ambulatory Visit: Payer: Medicare Other | Admitting: Internal Medicine

## 2018-10-20 ENCOUNTER — Telehealth: Payer: Self-pay

## 2018-10-20 NOTE — Telephone Encounter (Signed)
Call pt regarding lung screening. Per pt she will call later. Pt husband has broken hip and she is having to take care of him at this time.

## 2019-02-12 ENCOUNTER — Other Ambulatory Visit: Payer: Self-pay

## 2019-02-12 ENCOUNTER — Inpatient Hospital Stay
Admission: EM | Admit: 2019-02-12 | Discharge: 2019-03-04 | DRG: 208 | Disposition: E | Payer: Medicare Other | Attending: Internal Medicine | Admitting: Internal Medicine

## 2019-02-12 ENCOUNTER — Emergency Department: Payer: Medicare Other

## 2019-02-12 ENCOUNTER — Encounter: Payer: Self-pay | Admitting: Emergency Medicine

## 2019-02-12 DIAGNOSIS — J9601 Acute respiratory failure with hypoxia: Secondary | ICD-10-CM | POA: Diagnosis not present

## 2019-02-12 DIAGNOSIS — F419 Anxiety disorder, unspecified: Secondary | ICD-10-CM | POA: Diagnosis present

## 2019-02-12 DIAGNOSIS — Z79899 Other long term (current) drug therapy: Secondary | ICD-10-CM | POA: Diagnosis not present

## 2019-02-12 DIAGNOSIS — Z515 Encounter for palliative care: Secondary | ICD-10-CM

## 2019-02-12 DIAGNOSIS — Z9981 Dependence on supplemental oxygen: Secondary | ICD-10-CM

## 2019-02-12 DIAGNOSIS — Z882 Allergy status to sulfonamides status: Secondary | ICD-10-CM | POA: Diagnosis not present

## 2019-02-12 DIAGNOSIS — R030 Elevated blood-pressure reading, without diagnosis of hypertension: Secondary | ICD-10-CM | POA: Diagnosis present

## 2019-02-12 DIAGNOSIS — Z85828 Personal history of other malignant neoplasm of skin: Secondary | ICD-10-CM | POA: Diagnosis not present

## 2019-02-12 DIAGNOSIS — Z681 Body mass index (BMI) 19 or less, adult: Secondary | ICD-10-CM

## 2019-02-12 DIAGNOSIS — Z7189 Other specified counseling: Secondary | ICD-10-CM

## 2019-02-12 DIAGNOSIS — J441 Chronic obstructive pulmonary disease with (acute) exacerbation: Secondary | ICD-10-CM | POA: Diagnosis present

## 2019-02-12 DIAGNOSIS — J9600 Acute respiratory failure, unspecified whether with hypoxia or hypercapnia: Secondary | ICD-10-CM

## 2019-02-12 DIAGNOSIS — Z7952 Long term (current) use of systemic steroids: Secondary | ICD-10-CM | POA: Diagnosis not present

## 2019-02-12 DIAGNOSIS — Z20828 Contact with and (suspected) exposure to other viral communicable diseases: Secondary | ICD-10-CM | POA: Diagnosis present

## 2019-02-12 DIAGNOSIS — Z880 Allergy status to penicillin: Secondary | ICD-10-CM

## 2019-02-12 DIAGNOSIS — T380X5A Adverse effect of glucocorticoids and synthetic analogues, initial encounter: Secondary | ICD-10-CM | POA: Diagnosis present

## 2019-02-12 DIAGNOSIS — J9621 Acute and chronic respiratory failure with hypoxia: Secondary | ICD-10-CM | POA: Diagnosis present

## 2019-02-12 DIAGNOSIS — D72829 Elevated white blood cell count, unspecified: Secondary | ICD-10-CM | POA: Diagnosis present

## 2019-02-12 DIAGNOSIS — E43 Unspecified severe protein-calorie malnutrition: Secondary | ICD-10-CM | POA: Diagnosis present

## 2019-02-12 DIAGNOSIS — Z01818 Encounter for other preprocedural examination: Secondary | ICD-10-CM

## 2019-02-12 DIAGNOSIS — J9622 Acute and chronic respiratory failure with hypercapnia: Secondary | ICD-10-CM | POA: Diagnosis present

## 2019-02-12 DIAGNOSIS — Z7951 Long term (current) use of inhaled steroids: Secondary | ICD-10-CM

## 2019-02-12 DIAGNOSIS — Z66 Do not resuscitate: Secondary | ICD-10-CM | POA: Diagnosis present

## 2019-02-12 DIAGNOSIS — F17210 Nicotine dependence, cigarettes, uncomplicated: Secondary | ICD-10-CM | POA: Diagnosis present

## 2019-02-12 LAB — CBC WITH DIFFERENTIAL/PLATELET
Abs Immature Granulocytes: 0.3 10*3/uL — ABNORMAL HIGH (ref 0.00–0.07)
Basophils Absolute: 0 10*3/uL (ref 0.0–0.1)
Basophils Relative: 0 %
Eosinophils Absolute: 0.1 10*3/uL (ref 0.0–0.5)
Eosinophils Relative: 0 %
HCT: 46 % (ref 36.0–46.0)
Hemoglobin: 14.9 g/dL (ref 12.0–15.0)
Immature Granulocytes: 2 %
Lymphocytes Relative: 8 %
Lymphs Abs: 1.6 10*3/uL (ref 0.7–4.0)
MCH: 33.3 pg (ref 26.0–34.0)
MCHC: 32.4 g/dL (ref 30.0–36.0)
MCV: 102.9 fL — ABNORMAL HIGH (ref 80.0–100.0)
Monocytes Absolute: 1.6 10*3/uL — ABNORMAL HIGH (ref 0.1–1.0)
Monocytes Relative: 8 %
Neutro Abs: 16.4 10*3/uL — ABNORMAL HIGH (ref 1.7–7.7)
Neutrophils Relative %: 82 %
Platelets: 506 10*3/uL — ABNORMAL HIGH (ref 150–400)
RBC: 4.47 MIL/uL (ref 3.87–5.11)
RDW: 13.2 % (ref 11.5–15.5)
WBC: 20 10*3/uL — ABNORMAL HIGH (ref 4.0–10.5)
nRBC: 0 % (ref 0.0–0.2)

## 2019-02-12 LAB — BASIC METABOLIC PANEL
Anion gap: 9 (ref 5–15)
BUN: 17 mg/dL (ref 8–23)
CO2: 32 mmol/L (ref 22–32)
Calcium: 8.9 mg/dL (ref 8.9–10.3)
Chloride: 92 mmol/L — ABNORMAL LOW (ref 98–111)
Creatinine, Ser: 0.61 mg/dL (ref 0.44–1.00)
GFR calc Af Amer: >60 mL/min (ref >60)
GFR calc non Af Amer: >60 mL/min (ref >60)
Glucose, Bld: 114 mg/dL — ABNORMAL HIGH (ref 70–99)
Potassium: 4.3 mmol/L (ref 3.5–5.1)
Sodium: 133 mmol/L — ABNORMAL LOW (ref 135–145)

## 2019-02-12 LAB — BLOOD GAS, VENOUS
Acid-Base Excess: 8.1 mmol/L — ABNORMAL HIGH (ref 0.0–2.0)
Bicarbonate: 37.3 mmol/L — ABNORMAL HIGH (ref 20.0–28.0)
O2 Saturation: 80.3 %
Patient temperature: 37
pCO2, Ven: 74 mmHg (ref 44.0–60.0)
pH, Ven: 7.31 (ref 7.250–7.430)
pO2, Ven: 49 mmHg — ABNORMAL HIGH (ref 32.0–45.0)

## 2019-02-12 LAB — SARS CORONAVIRUS 2 BY RT PCR (HOSPITAL ORDER, PERFORMED IN ~~LOC~~ HOSPITAL LAB): SARS Coronavirus 2: NEGATIVE

## 2019-02-12 MED ORDER — ONDANSETRON HCL 4 MG/2ML IJ SOLN: 4.0000 mg | Freq: Four times a day (QID) | INTRAMUSCULAR | Status: DC | PRN

## 2019-02-12 MED ORDER — LORAZEPAM 1 MG PO TABS
0.5000 mg | ORAL_TABLET | Freq: Two times a day (BID) | ORAL | Status: DC | PRN
  Administered 2019-02-12 – 2019-02-14 (×3): 0.5 mg via ORAL
  Filled 2019-02-12 (×3): qty 1

## 2019-02-12 MED ORDER — ACETAMINOPHEN 325 MG PO TABS
650.0000 mg | ORAL_TABLET | Freq: Four times a day (QID) | ORAL | Status: DC | PRN
  Administered 2019-02-12: 650 mg via ORAL
  Filled 2019-02-12: qty 2

## 2019-02-12 MED ORDER — ALBUTEROL (5 MG/ML) CONTINUOUS INHALATION SOLN
10.0000 mg/h | INHALATION_SOLUTION | Freq: Once | RESPIRATORY_TRACT | Status: AC
  Administered 2019-02-12: 11:00:00 10 mg/h via RESPIRATORY_TRACT
  Filled 2019-02-12: qty 20

## 2019-02-12 MED ORDER — METHYLPREDNISOLONE SODIUM SUCC 125 MG IJ SOLR
60.0000 mg | Freq: Two times a day (BID) | INTRAMUSCULAR | Status: DC
  Administered 2019-02-12 – 2019-02-16 (×8): 60 mg via INTRAVENOUS
  Filled 2019-02-12 (×8): qty 2

## 2019-02-12 MED ORDER — IPRATROPIUM-ALBUTEROL 0.5-2.5 (3) MG/3ML IN SOLN
3.0000 mL | Freq: Four times a day (QID) | RESPIRATORY_TRACT | Status: DC
  Administered 2019-02-12 – 2019-02-14 (×8): 3 mL via RESPIRATORY_TRACT
  Filled 2019-02-12 (×8): qty 3

## 2019-02-12 MED ORDER — IBUPROFEN 400 MG PO TABS: 400.0000 mg | ORAL_TABLET | Freq: Four times a day (QID) | ORAL | Status: DC | PRN

## 2019-02-12 MED ORDER — ALBUTEROL SULFATE (2.5 MG/3ML) 0.083% IN NEBU
2.5000 mg | INHALATION_SOLUTION | RESPIRATORY_TRACT | Status: DC | PRN
  Administered 2019-02-12: 2.5 mg via RESPIRATORY_TRACT
  Filled 2019-02-12: qty 3

## 2019-02-12 MED ORDER — TIOTROPIUM BROMIDE MONOHYDRATE 18 MCG IN CAPS
18.0000 ug | ORAL_CAPSULE | Freq: Every day | RESPIRATORY_TRACT | Status: DC
  Filled 2019-02-12: qty 5

## 2019-02-12 MED ORDER — POLYETHYLENE GLYCOL 3350 17 G PO PACK: 17.0000 g | PACK | Freq: Every day | ORAL | Status: DC | PRN

## 2019-02-12 MED ORDER — ENOXAPARIN SODIUM 40 MG/0.4ML ~~LOC~~ SOLN
40.0000 mg | SUBCUTANEOUS | Status: DC
  Administered 2019-02-12 – 2019-02-14 (×3): 40 mg via SUBCUTANEOUS
  Filled 2019-02-12 (×3): qty 0.4

## 2019-02-12 MED ORDER — MOMETASONE FURO-FORMOTEROL FUM 200-5 MCG/ACT IN AERO
2.0000 | INHALATION_SPRAY | Freq: Two times a day (BID) | RESPIRATORY_TRACT | Status: DC
  Administered 2019-02-12 – 2019-02-13 (×2): 2 via RESPIRATORY_TRACT
  Filled 2019-02-12: qty 8.8

## 2019-02-12 MED ORDER — SODIUM CHLORIDE 0.9 % IV BOLUS
1000.0000 mL | Freq: Once | INTRAVENOUS | Status: AC
  Administered 2019-02-12: 11:00:00 1000 mL via INTRAVENOUS

## 2019-02-12 MED ORDER — MAGNESIUM SULFATE 2 GM/50ML IV SOLN
2.0000 g | Freq: Once | INTRAVENOUS | Status: AC
  Administered 2019-02-12: 2 g via INTRAVENOUS
  Filled 2019-02-12: qty 50

## 2019-02-12 MED ORDER — IPRATROPIUM BROMIDE 0.02 % IN SOLN
1.0000 mg | Freq: Once | RESPIRATORY_TRACT | Status: AC
  Administered 2019-02-12: 1 mg via RESPIRATORY_TRACT
  Filled 2019-02-12: qty 5

## 2019-02-12 MED ORDER — ALBUTEROL SULFATE (2.5 MG/3ML) 0.083% IN NEBU
INHALATION_SOLUTION | RESPIRATORY_TRACT | Status: AC
  Filled 2019-02-12: qty 12

## 2019-02-12 MED ORDER — ACETAMINOPHEN 650 MG RE SUPP: 650.0000 mg | Freq: Four times a day (QID) | RECTAL | Status: DC | PRN

## 2019-02-12 MED ORDER — ALBUTEROL SULFATE (2.5 MG/3ML) 0.083% IN NEBU
5.0000 mg | INHALATION_SOLUTION | Freq: Once | RESPIRATORY_TRACT | Status: AC
  Administered 2019-02-12: 5 mg via RESPIRATORY_TRACT
  Filled 2019-02-12: qty 6

## 2019-02-12 MED ORDER — ONDANSETRON HCL 4 MG PO TABS: 4.0000 mg | ORAL_TABLET | Freq: Four times a day (QID) | ORAL | Status: DC | PRN

## 2019-02-12 NOTE — Progress Notes (Signed)
Ch visited pt regarding AD education. Pt shared that her husband Simona Huh) is able to make decisions on her behalf. Pt was admitted for issues related to COPD. Upon ch arrival the pt was on a [place] and presented to be a/o yet struggled to hold a conversation without exp SOB. Pt shared that her breathing problems started when she witnessed her husband falling a few months ago. Pt shared that she gets anxious at times especially after seeing her husband fall and have to be hospitalized. Pt needs the help of her husband but she was his care giver months ago. Ch allowed space for the pt to lament about the struggle to gain her health back and to play with her grandchild without risking being hospitalized due to the COPD. Ch visit was appreciated.  F/U with pt regarding GOC/POC.    March 06, 2019 1800  Clinical Encounter Type  Visited With Patient  Visit Type Spiritual support;Social support  Referral From Physician  Consult/Referral To Chaplain  Spiritual Encounters  Spiritual Needs Grief support;Emotional  Stress Factors  Patient Stress Factors Exhausted;Family relationships;Health changes;Lack of caregivers;Loss of control;Major life changes  Family Stress Factors Major life changes;Loss of control;Health changes  Advance Directives (For Healthcare)  Does Patient Have a Medical Advance Directive? No  Would patient like information on creating a medical advance directive? Yes (Inpatient - patient defers creating a medical advance directive at this time - Information given)

## 2019-02-12 NOTE — ED Notes (Signed)
Pt taken off of bipap per MD request and placed on 4L via nasal canula.

## 2019-02-12 NOTE — ED Notes (Signed)
.. ED TO INPATIENT HANDOFF REPORT  ED Nurse Name and Phone #:  Pattricia Bossnnie [phone]  S Name/Age/Gender Audrey Ochoa 68 y.o. female Room/Bed: ED05A/ED05A  Code Status   Code Status: Full Code  Home/SNF/Other Home Patient oriented to: self, place, time and situation Is this baseline? Yes   Triage Complete: Triage complete  Chief Complaint breathing difficulty  Triage Note Arrives [facility] with c/o wheezing and SOB -- worsening last night.   Wears 2l/ [place] home oxygen prn -- initial spo2- 90%.  EMS reports 125 mg solumedrol, 2 duo nebs, 1 albuterol PTA.   Allergies Allergies  Allergen Reactions  . Penicillins   . Sulfa Antibiotics Rash    Level of Care/Admitting Diagnosis ED Disposition    ED Disposition Condition Comment   Admit  Hospital Area: [facility] [[id number]]  Level of Care: Med-Surg [16]  Covid Evaluation: Confirmed COVID Negative  Diagnosis: COPD exacerbation [facility](HCC) [[id number]]  Admitting Physician: Milagros LollSUDINI, SRIKAR [[id number]]  Attending Physician: Milagros LollSUDINI, SRIKAR [[id number]]  Estimated length of stay: past midnight tomorrow  Certification:: I certify this patient will need inpatient services for at least 2 midnights  PT Class (Do Not Modify): Inpatient [101]  PT Acc Code (Do Not Modify): Private [1]       B Medical/Surgery History Past Medical History:  Diagnosis Date  . Emphysema of lung [facility](HCC)    Past Surgical History:  Procedure Laterality Date  . none       A IV Location/Drains/Wounds Patient Lines/Drains/Airways Status   Active Line/Drains/Airways    Name:   Placement date:   Placement time:   Site:   Days:   Peripheral IV 02/07/2019 Left Antecubital   02/06/2019    1018    Antecubital   less than 1   Peripheral IV 02/01/2019 Left Hand   02/28/2019    1018    Hand   less than 1          Intake/Output Last 24 hours  Intake/Output Summary (Last 24 hours) at 02/25/2019 1513 Last data filed at 02/26/2019 1447 Gross per 24 hour  Intake 1050  ml  Output 700 ml  Net 350 ml    Labs/Imaging Results for orders placed or performed during the hospital encounter of 02/03/2019 (from the past 48 hour(s))  CBC with Differential     Status: Abnormal   Collection Time: 02/14/2019 10:16 AM  Result Value Ref Range   WBC 20.0 (H) 4.0 - 10.5 K/uL   RBC 4.47 3.87 - 5.11 MIL/uL   Hemoglobin 14.9 12.0 - 15.0 g/dL   HCT 40.146.0 02.736.0 - 25.346.0 %   MCV 102.9 (H) 80.0 - 100.0 fL   MCH 33.3 26.0 - 34.0 pg   MCHC 32.4 30.0 - 36.0 g/dL   RDW 66.413.2 40.311.5 - 47.415.5 %   Platelets 506 (H) 150 - 400 K/uL   nRBC 0.0 0.0 - 0.2 %   Neutrophils Relative % 82 %   Neutro Abs 16.4 (H) 1.7 - 7.7 K/uL   Lymphocytes Relative 8 %   Lymphs Abs 1.6 0.7 - 4.0 K/uL   Monocytes Relative 8 %   Monocytes Absolute 1.6 (H) 0.1 - 1.0 K/uL   Eosinophils Relative 0 %   Eosinophils Absolute 0.1 0.0 - 0.5 K/uL   Basophils Relative 0 %   Basophils Absolute 0.0 0.0 - 0.1 K/uL   Immature Granulocytes 2 %   Abs Immature Granulocytes 0.30 (H) 0.00 - 0.07 K/uL    Comment: Performed at [facility], [address] [place], [place] [address]  Basic metabolic panel     Status: Abnormal   Collection Time: 02/16/2019 10:16 AM  Result Value Ref Range   Sodium 133 (L) 135 - 145 mmol/L   Potassium 4.3 3.5 - 5.1 mmol/L   Chloride 92 (L) 98 - 111 mmol/L   CO2 32 22 - 32 mmol/L   Glucose, Bld 114 (H) 70 - 99 mg/dL   BUN 17 8 - 23 mg/dL   Creatinine, Ser 1.77 0.44 - 1.00 mg/dL   Calcium 8.9 8.9 - 93.9 mg/dL   GFR calc non Af Amer >60 >60 mL/min   GFR calc Af Amer >60 >60 mL/min   Anion gap 9 5 - 15    Comment: Performed at [facility], [address] [place], [place] [address]  Blood gas, venous     Status: Abnormal   Collection Time: 02/11/2019 10:30 AM  Result Value Ref Range   pH, Ven 7.31 7.250 - 7.430   pCO2, Ven 74 (HH) 44.0 - 60.0 mmHg    Comment: CRITICAL RESULT CALLED TO, READ BACK BY AND VERIFIED WITH:  ISSACS AT 1100, 02/21/2019, LS    pO2, Ven 49.0 (H)  32.0 - 45.0 mmHg   Bicarbonate 37.3 (H) 20.0 - 28.0 mmol/L   Acid-Base Excess 8.1 (H) 0.0 - 2.0 mmol/L   O2 Saturation 80.3 %   Patient temperature 37.0    Collection site VENOUS    Sample type VENOUS     Comment: Performed at [facility], [address] [place], [place] [address]  SARS Coronavirus 2 by RT PCR (hospital order, performed in [facility] hospital lab) Nasopharyngeal Nasopharyngeal Swab     Status: None   Collection Time: 02/13/2019 10:31 AM   Specimen: Nasopharyngeal Swab  Result Value Ref Range   SARS Coronavirus 2 NEGATIVE NEGATIVE    Comment: (NOTE) If result is NEGATIVE SARS-CoV-2 target nucleic acids are NOT DETECTED. The SARS-CoV-2 RNA is generally detectable in upper and lower  respiratory specimens during the acute phase of infection. The lowest  concentration of SARS-CoV-2 viral copies this assay can detect is 250  copies / mL. A negative result does not preclude SARS-CoV-2 infection  and should not be used as the sole basis for treatment or other  patient management decisions.  A negative result may occur with  improper specimen collection / handling, submission of specimen other  than nasopharyngeal swab, presence of viral mutation(s) within the  areas targeted by this assay, and inadequate number of viral copies  (<250 copies / mL). A negative result must be combined with clinical  observations, patient history, and epidemiological information. If result is POSITIVE SARS-CoV-2 target nucleic acids are DETECTED. The SARS-CoV-2 RNA is generally detectable in upper and lower  respiratory specimens dur ing the acute phase of infection.  Positive  results are indicative of active infection with SARS-CoV-2.  Clinical  correlation with patient history and other diagnostic information is  necessary to determine patient infection status.  Positive results do  not rule out bacterial infection or co-infection with other viruses. If result is PRESUMPTIVE  POSTIVE SARS-CoV-2 nucleic acids MAY BE PRESENT.   A presumptive positive result was obtained on the submitted specimen  and confirmed on repeat testing.  While 2019 novel coronavirus  (SARS-CoV-2) nucleic acids may be present in the submitted sample  additional confirmatory testing may be necessary for epidemiological  and / or clinical management purposes  to differentiate  between  SARS-CoV-2 and other Sarbecovirus currently known to infect humans.  If clinically indicated additional testing with an alternate test  methodology [phone]) is advised. The SARS-CoV-2 RNA is generally  detectable in upper and lower respiratory sp ecimens during the acute  phase of infection. The expected result is Negative. Fact Sheet for Patients:  BoilerBrush.com.cy Fact Sheet for Healthcare Providers: https://pope.com/ This test is not yet approved or cleared by the [place] FDA and has been authorized for detection and/or diagnosis of SARS-CoV-2 by FDA under an Emergency Use Authorization (EUA).  This EUA will remain in effect (meaning this test can be used) for the duration of the COVID-19 declaration under Section 564(b)(1) of the Act, 21 U.S.C. section 360bbb-3(b)(1), unless the authorization is terminated or revoked sooner. Performed at [facility], [address] [place], [place] [address]    Dg Chest Portable 1 View  Result Date: 03/03/2019 CLINICAL DATA:  Shortness of breath EXAM: PORTABLE CHEST 1 VIEW COMPARISON:  05/18/2018 FINDINGS: There is hyperinflation of the lungs compatible with COPD. Heart and mediastinal contours are within normal limits. No focal opacities or effusions. No acute bony abnormality. IMPRESSION: COPD.  No active disease. Electronically Signed   By: Charlett Nose M.D.   On: 02/16/2019 11:17    Pending Labs Unresulted Labs (From admission, onward)    Start     Ordered   02/19/19 0500  Creatinine, serum   (enoxaparin (LOVENOX)    CrCl >/= 30 ml/min)  Weekly,   STAT    Comments: while on enoxaparin therapy    02/21/2019 1445   02/13/19 0500  Basic metabolic panel  Tomorrow morning,   STAT     02/01/2019 1445   02/13/19 0500  CBC  Tomorrow morning,   STAT     03/02/2019 1445   02/28/2019 1445  HIV Antibody (routine testing w rflx)  (HIV Antibody (Routine testing w reflex) panel)  Add-on,   AD     02/23/2019 1445   02/18/2019 1445  HIV4GL Save Tube  (HIV Antibody (Routine testing w reflex) panel)  Add-on,   AD     02/18/2019 1445          Vitals/Pain Today's Vitals   02/25/2019 1116 02/16/2019 1130 02/21/2019 1145 02/21/2019 1248  BP:  131/84    Pulse:  92    Resp:  12    Temp:      TempSrc:      SpO2:  99%    Weight:      Height:      PainSc: 0-No pain  0-No pain 0-No pain    Isolation Precautions No active isolations  Medications Medications  albuterol (PROVENTIL) (2.5 MG/3ML) 0.083% nebulizer solution (  Not Given 02/14/2019 1040)  enoxaparin (LOVENOX) injection 40 mg (has no administration in time range)  acetaminophen (TYLENOL) tablet 650 mg (has no administration in time range)    Or  acetaminophen (TYLENOL) suppository 650 mg (has no administration in time range)  polyethylene glycol (MIRALAX / GLYCOLAX) packet 17 g (has no administration in time range)  ibuprofen (ADVIL) tablet 400 mg (has no administration in time range)  ondansetron (ZOFRAN) tablet 4 mg (has no administration in time range)    Or  ondansetron (ZOFRAN) injection 4 mg (has no administration in time range)  albuterol (PROVENTIL) (2.5 MG/3ML) 0.083% nebulizer solution 2.5 mg (has no administration in time range)  methylPREDNISolone sodium succinate (SOLU-MEDROL) 125 mg/2 mL injection 60 mg (has no administration in time range)  ipratropium-albuterol (DUONEB) 0.5-2.5 (3) MG/3ML nebulizer solution 3 mL (has no administration in time range)  magnesium sulfate IVPB 2 g 50 mL (0 g Intravenous Stopped 02/09/2019 1148)  albuterol  (PROVENTIL,VENTOLIN) solution continuous neb (10 mg/hr Nebulization Given 02/15/2019 1040)  ipratropium (ATROVENT) nebulizer solution 1 mg (1 mg Nebulization Given 02/26/2019 1037)  sodium chloride 0.9 % bolus 1,000 mL (0 mLs Intravenous Stopped 02/09/2019 1248)  albuterol (PROVENTIL) (2.5 MG/3ML) 0.083% nebulizer solution 5 mg (5 mg Nebulization Given 02/26/2019 1227)    Mobility walks with device Low fall risk   Focused Assessments Pulmonary Assessment Handoff:  Lung sounds: Bilateral Breath Sounds: Diminished L Breath Sounds: Diminished R Breath Sounds: Diminished O2 Device: Bi-PAP O2 Flow Rate (L/min): 4 L/min      R Recommendations: See Admitting Provider Note  Report given to:   Additional Notes:

## 2019-02-12 NOTE — ED Provider Notes (Signed)
[facility] Emergency Department Provider Note  ____________________________________________   First MD Initiated Contact with Patient 02/04/2019 1002     (approximate)  I have reviewed the triage vital signs and the nursing notes.   HISTORY  Chief Complaint Respiratory Distress    HPI Audrey Ochoa is a 68 y.o. female  With h/o COPD here with SOB. Pt reports that over the past week, she's had progressively worsening cough w/ SOB. She saw her PCP and was prescribed prednisone along with Levaquin. Has been taking it without significant relief. Over the past 24 hr she has had worsening cough, SOB, and wheezing. Had difficulty just walking around home. No known fevers. She's been producing a significant amount of sinus drainage as well. No hemoptysis. H/o COPD, has been using her inhalers every 8 hours or so.       Past Medical History:  Diagnosis Date  . Emphysema of lung [facility])     Patient Active Problem List   Diagnosis Date Noted  . COPD exacerbation (HCC) 02/13/2019  . COPD (chronic obstructive pulmonary disease) (HCC) 06/05/2018  . Malnutrition of moderate degree 05/19/2018  . Acute respiratory failure with hypoxemia (HCC) 05/15/2018  . Personal history of skin cancer 12/04/2014    Past Surgical History:  Procedure Laterality Date  . none      Prior to Admission medications   Medication Sig Start Date End Date Taking? Authorizing Provider  acetaminophen (TYLENOL) 500 MG tablet Take 1,000 mg by mouth every 6 (six) hours as needed.   Yes [provider]  albuterol (PROVENTIL HFA;VENTOLIN HFA) 108 (90 Base) MCG/ACT inhaler Inhale 2 puffs into the lungs every 6 (six) hours as needed for wheezing.   Yes [provider]  budesonide-formoterol (SYMBICORT) 160-4.5 MCG/ACT inhaler Inhale 2 puffs into the lungs every 12 (twelve) hours. 05/21/18  Yes Enid Baas, MD  ipratropium-albuterol (DUONEB) 0.5-2.5 (3) MG/3ML SOLN Take  3 mLs by nebulization every 4 (four) hours as needed. 05/21/18  Yes Enid Baas, MD  levofloxacin (LEVAQUIN) 500 MG tablet Take 500 mg by mouth daily. 02/06/19  Yes [provider]  predniSONE (DELTASONE) 10 MG tablet 6 PO Q D X 1 DAY, THEN 5 PO Q D X 1 DAY, THEN 4 PO Q D X 1 DAY, THEN 3 PO Q D X 1 DAY, THEN 2 PO Q D X 1 DAY, THEN 1 PO Q D X 1 DAY 05/13/18  Yes [provider]  tiotropium (SPIRIVA HANDIHALER) 18 MCG inhalation capsule Place 1 tablet into inhaler and inhale daily.   Yes [provider]    Allergies Penicillins and Sulfa antibiotics  No family history on file.  Social History Social History   Tobacco Use  . Smoking status: Former Smoker    Packs/day: 0.75    Years: 48.00    Pack years: 36.00    Types: Cigarettes    Quit date: 05/25/2018    Years since quitting: 0.7  . Smokeless tobacco: Never Used  Substance Use Topics  . Alcohol use: Yes  . Drug use: Not on file    Review of Systems  Review of Systems  Constitutional: Positive for fatigue. Negative for fever.  HENT: Positive for sinus pressure. Negative for congestion and sore throat.   Eyes: Negative for visual disturbance.  Respiratory: Positive for cough, shortness of breath and wheezing.   Cardiovascular: Negative for chest pain.  Gastrointestinal: Negative for abdominal pain, diarrhea, nausea and vomiting.  Genitourinary: Negative for flank  pain.  Musculoskeletal: Negative for back pain and neck pain.  Skin: Negative for rash and wound.  Neurological: Positive for weakness.  All other systems reviewed and are negative.    ____________________________________________  PHYSICAL EXAM:      VITAL SIGNS: ED Triage Vitals  Enc Vitals Group     BP 08/19/18 1006 (!) 183/90     Pulse Rate 08/19/18 1006 (!) 114     Resp 08/19/18 1006 (!) 35     Temp --      Temp src --      SpO2 08/19/18 1005 96 %     Weight 08/19/18 1007 98 lb 15.8 oz (44.9 kg)     Height 08/19/18 1007  5\' 1"  (1.549 m)     Head Circumference --      Peak Flow --      Pain Score 08/19/18 1007 0     Pain Loc --      Pain Edu? --      Excl. in GC? --      Physical Exam Vitals signs and nursing note reviewed.  Constitutional:      General: She is not in acute distress.    Appearance: She is well-developed. She is ill-appearing.  HENT:     Head: Normocephalic and atraumatic.  Eyes:     Conjunctiva/sclera: Conjunctivae normal.  Neck:     Musculoskeletal: Neck supple.  Cardiovascular:     Rate and Rhythm: Normal rate and regular rhythm.     Heart sounds: Normal heart sounds. No murmur. No friction rub.  Pulmonary:     Effort: Tachypnea and accessory muscle usage present. No respiratory distress.     Breath sounds: Decreased air movement present. Examination of the right-upper field reveals wheezing. Examination of the left-upper field reveals wheezing. Examination of the right-middle field reveals wheezing. Examination of the left-middle field reveals wheezing. Examination of the right-lower field reveals wheezing. Examination of the left-lower field reveals wheezing. Decreased breath sounds and wheezing present. No rales.  Abdominal:     General: There is no distension.     Palpations: Abdomen is soft.     Tenderness: There is no abdominal tenderness.  Skin:    General: Skin is warm.     Capillary Refill: Capillary refill takes less than 2 seconds.  Neurological:     Mental Status: She is alert and oriented to person, place, and time.     Motor: No abnormal muscle tone.       ____________________________________________   LABS (all labs ordered are listed, but only abnormal results are displayed)  Labs Reviewed  BLOOD GAS, VENOUS - Abnormal; Notable for the following components:      Result Value   pCO2, Ven 74 (*)    pO2, Ven 49.0 (*)    Bicarbonate 37.3 (*)    Acid-Base Excess 8.1 (*)    All other components within normal limits  CBC WITH DIFFERENTIAL/PLATELET -  Abnormal; Notable for the following components:   WBC 20.0 (*)    MCV 102.9 (*)    Platelets 506 (*)    Neutro Abs 16.4 (*)    Monocytes Absolute 1.6 (*)    Abs Immature Granulocytes 0.30 (*)    All other components within normal limits  BASIC METABOLIC PANEL - Abnormal; Notable for the following components:   Sodium 133 (*)    Chloride 92 (*)    Glucose, Bld 114 (*)    All other components within normal limits  SARS CORONAVIRUS 2 BY RT PCR (HOSPITAL ORDER, [place] [facility])  HIV ANTIBODY (ROUTINE TESTING W REFLEX)  HIV4GL SAVE TUBE    ____________________________________________  EKG: Sinus tachycardia, VR 114. Intervals as below. RAE, no acute St-t segment changes. ________________________________________  RADIOLOGY All imaging, including plain films, CT scans, and ultrasounds, independently reviewed by me, and interpretations confirmed via formal radiology reads.  ED MD interpretation:   CXR: Clear, no acute abnormality   Official radiology report(s): Dg Chest Portable 1 View  Result Date: Mar 13, 2019 CLINICAL DATA:  Shortness of breath EXAM: PORTABLE CHEST 1 VIEW COMPARISON:  05/18/2018 FINDINGS: There is hyperinflation of the lungs compatible with COPD. Heart and mediastinal contours are within normal limits. No focal opacities or effusions. No acute bony abnormality. IMPRESSION: COPD.  No active disease. Electronically Signed   By: Rolm Baptise M.D.   On: 03/13/2019 11:17    ____________________________________________  PROCEDURES   Procedure(s) performed (including Critical Care):  .Critical Care Performed by: Duffy Bruce, MD Authorized by: Duffy Bruce, MD   Critical care provider statement:    Critical care time (minutes):  35   Critical care time was exclusive of:  Separately billable procedures and treating other patients and teaching time   Critical care was necessary to treat or prevent imminent or life-threatening  deterioration of the following conditions:  Cardiac failure, circulatory failure and respiratory failure   Critical care was time spent personally by me on the following activities:  Development of treatment plan with patient or surrogate, discussions with consultants, evaluation of patient's response to treatment, examination of patient, obtaining history from patient or surrogate, ordering and performing treatments and interventions, ordering and review of laboratory studies, ordering and review of radiographic studies, pulse oximetry, re-evaluation of patient's condition and review of old charts   I assumed direction of critical care for this patient from another provider in my specialty: no      ____________________________________________  INITIAL IMPRESSION / MDM / [place] / ED COURSE  As part of my medical decision making, I reviewed the following data within the electronic MEDICAL RECORD NUMBER Notes from prior ED visits and [place] was evaluated in Emergency Department on 03-13-2019 for the symptoms described in the history of present illness. She was evaluated in the context of the global COVID-19 pandemic, which necessitated consideration that the patient might be at risk for infection with the SARS-CoV-2 virus that causes COVID-19. Institutional protocols and algorithms that pertain to the evaluation of patients at risk for COVID-19 are in a state of rapid change based on information released by regulatory bodies including the CDC and federal and state organizations. These policies and algorithms were followed during the patient's care in the ED.  Some ED evaluations and interventions may be delayed as a result of limited staffing during the pandemic.*      Medical Decision Making:  68 yo F here with cough, SOB, wheezing. Exam as above. Pt with marked tachypnea on arrival, placed on BIPAP. S/p solumedrol in EMS. CAT started and  IV Mag given, with moderate improvement. Second neb given w/ significant improvement. Able to wean off to 4L [place]. CXR clear. COVID neg. Admit.  ____________________________________________  FINAL CLINICAL IMPRESSION(S) / ED DIAGNOSES  Final diagnoses:  COPD exacerbation ([place])  Acute respiratory failure with hypoxia ([place])     MEDICATIONS GIVEN DURING THIS VISIT:  Medications  albuterol (PROVENTIL) (2.5 MG/3ML) 0.083% nebulizer  solution (  Not Given 02/04/2019 1040)  enoxaparin (LOVENOX) injection 40 mg (has no administration in time range)  acetaminophen (TYLENOL) tablet 650 mg (has no administration in time range)    Or  acetaminophen (TYLENOL) suppository 650 mg (has no administration in time range)  polyethylene glycol (MIRALAX / GLYCOLAX) packet 17 g (has no administration in time range)  ibuprofen (ADVIL) tablet 400 mg (has no administration in time range)  ondansetron (ZOFRAN) tablet 4 mg (has no administration in time range)    Or  ondansetron (ZOFRAN) injection 4 mg (has no administration in time range)  albuterol (PROVENTIL) (2.5 MG/3ML) 0.083% nebulizer solution 2.5 mg (has no administration in time range)  methylPREDNISolone sodium succinate (SOLU-MEDROL) 125 mg/2 mL injection 60 mg (has no administration in time range)  ipratropium-albuterol (DUONEB) 0.5-2.5 (3) MG/3ML nebulizer solution 3 mL (has no administration in time range)  magnesium sulfate IVPB 2 g 50 mL (0 g Intravenous Stopped 03/01/2019 1148)  albuterol (PROVENTIL,VENTOLIN) solution continuous neb (10 mg/hr Nebulization Given 02/06/2019 1040)  ipratropium (ATROVENT) nebulizer solution 1 mg (1 mg Nebulization Given 02/01/2019 1037)  sodium chloride 0.9 % bolus 1,000 mL (0 mLs Intravenous Stopped 02/16/2019 1248)  albuterol (PROVENTIL) (2.5 MG/3ML) 0.083% nebulizer solution 5 mg (5 mg Nebulization Given 02/05/2019 1227)     ED Discharge Orders    None       Note:  This document was prepared using Dragon voice  recognition software and may include unintentional dictation errors.   Shaune Pollack, MD 02/26/2019 [phone]

## 2019-02-12 NOTE — H&P (Signed)
[facility] [place] NAME: Audrey Ochoa    MR#:  [id number]  DATE OF BIRTH:  04-20-1951  DATE OF ADMISSION:  02/08/2019  PRIMARY CARE PHYSICIAN: Alene Mires Elyse Jarvis, MD   REQUESTING/REFERRING PHYSICIAN: Dr. Royden Purl   CHIEF COMPLAINT:   Chief Complaint  Patient presents with  . Respiratory Distress    HISTORY OF PRESENT ILLNESS:  Audrey Ochoa  is a 68 y.o. female with a known history of emphysema, as needed oxygen use at home presents to the emergency room complaining of 5 days of worsening shortness of breath, cough and wheezing.  Symptoms continue to worsen yesterday and she had to call 911 after multiple nebulizer treatments did not help.  Here initially patient was on BiPAP and presently on 4 L oxygen.  Chest x-ray shows changes of COPD but nothing acute.  Afebrile.  Patient's COVID-19 test is negative. Patient uses nebulizers and inhalers at home which have not helped.  She was also on prednisone.  Oxygen as needed.  PAST MEDICAL HISTORY:   Past Medical History:  Diagnosis Date  . Emphysema of lung ([place])     PAST SURGICAL HISTORY:   Past Surgical History:  Procedure Laterality Date  . none      SOCIAL HISTORY:   Social History   Tobacco Use  . Smoking status: Former Smoker    Packs/day: 0.75    Years: 48.00    Pack years: 36.00    Types: Cigarettes    Quit date: 05/25/2018    Years since quitting: 0.7  . Smokeless tobacco: Never Used  Substance Use Topics  . Alcohol use: Yes    FAMILY HISTORY:   No history of lung cancer  DRUG ALLERGIES:   Allergies  Allergen Reactions  . Penicillins   . Sulfa Antibiotics Rash    REVIEW OF SYSTEMS:   Review of Systems  Constitutional: Positive for malaise/fatigue. Negative for chills and fever.  HENT: Negative for sore throat.   Eyes: Negative for blurred vision, double vision and pain.  Respiratory: Positive for cough, shortness of breath and wheezing.  Negative for hemoptysis.   Cardiovascular: Negative for chest pain, palpitations, orthopnea and leg swelling.  Gastrointestinal: Negative for abdominal pain, constipation, diarrhea, heartburn, nausea and vomiting.  Genitourinary: Negative for dysuria and hematuria.  Musculoskeletal: Negative for back pain and joint pain.  Skin: Negative for rash.  Neurological: Negative for sensory change, speech change, focal weakness and headaches.  Endo/Heme/Allergies: Does not bruise/bleed easily.  Psychiatric/Behavioral: Negative for depression. The patient is not nervous/anxious.     MEDICATIONS AT HOME:   Prior to Admission medications   Medication Sig Start Date End Date Taking? Authorizing Provider  acetaminophen (TYLENOL) 500 MG tablet Take 1,000 mg by mouth every 6 (six) hours as needed.   Yes [provider]  albuterol (PROVENTIL HFA;VENTOLIN HFA) 108 (90 Base) MCG/ACT inhaler Inhale 2 puffs into the lungs every 6 (six) hours as needed for wheezing.   Yes [provider]  budesonide-formoterol (SYMBICORT) 160-4.5 MCG/ACT inhaler Inhale 2 puffs into the lungs every 12 (twelve) hours. 05/21/18  Yes Gladstone Lighter, MD  ipratropium-albuterol (DUONEB) 0.5-2.5 (3) MG/3ML SOLN Take 3 mLs by nebulization every 4 (four) hours as needed. 05/21/18  Yes Gladstone Lighter, MD  levofloxacin (LEVAQUIN) 500 MG tablet Take 500 mg by mouth daily. 02/06/19  Yes [provider]  predniSONE (DELTASONE) 10 MG tablet 6 PO Q D X 1 DAY, THEN 5 PO Q D X  1 DAY, THEN 4 PO Q D X 1 DAY, THEN 3 PO Q D X 1 DAY, THEN 2 PO Q D X 1 DAY, THEN 1 PO Q D X 1 DAY 05/13/18  Yes [provider]  tiotropium (SPIRIVA HANDIHALER) 18 MCG inhalation capsule Place 1 tablet into inhaler and inhale daily.   Yes [provider]     VITAL SIGNS:  Blood pressure 131/84, pulse 92, temperature 97.6 F (36.4 C), temperature source Axillary, resp. rate 12, height 5\' 1"  (1.549 m), weight 49.4 kg, SpO2 99  %.  PHYSICAL EXAMINATION:  Physical Exam  GENERAL:  68 y.o.-year-old patient lying in the bed with conversational dyspnea EYES: Pupils equal, round, reactive to light and accommodation. No scleral icterus. Extraocular muscles intact.  HEENT: Head atraumatic, normocephalic. Oropharynx and nasopharynx clear. No oropharyngeal erythema, moist oral mucosa  NECK:  Supple, no jugular venous distention. No thyroid enlargement, no tenderness.  LUNGS: Increased work of breathing.  Bilateral wheezing.  Decreased air entry. CARDIOVASCULAR: S1, S2 normal. No murmurs, rubs, or gallops.  ABDOMEN: Soft, nontender, nondistended. Bowel sounds present. No organomegaly or mass.  EXTREMITIES: No pedal edema, cyanosis, or clubbing. + 2 pedal & radial pulses b/l.   NEUROLOGIC: Cranial nerves II through XII are intact. No focal Motor or sensory deficits appreciated b/l PSYCHIATRIC: The patient is alert and oriented x 3. Good affect.  SKIN: No obvious rash, lesion, or ulcer.   LABORATORY PANEL:   CBC Recent Labs  Lab 03-08-2019 1016  WBC 20.0*  HGB 14.9  HCT 46.0  PLT 506*   ------------------------------------------------------------------------------------------------------------------  Chemistries  Recent Labs  Lab 03/08/2019 1016  NA 133*  K 4.3  CL 92*  CO2 32  GLUCOSE 114*  BUN 17  CREATININE 0.61  CALCIUM 8.9   ------------------------------------------------------------------------------------------------------------------  Cardiac Enzymes No results for input(s): TROPONINI in the last 168 hours. ------------------------------------------------------------------------------------------------------------------  RADIOLOGY:  Dg Chest Portable 1 View  Result Date: 03-08-2019 CLINICAL DATA:  Shortness of breath EXAM: PORTABLE CHEST 1 VIEW COMPARISON:  05/18/2018 FINDINGS: There is hyperinflation of the lungs compatible with COPD. Heart and mediastinal contours are within normal limits. No  focal opacities or effusions. No acute bony abnormality. IMPRESSION: COPD.  No active disease. Electronically Signed   By: 05/20/2018 M.D.   On: March 08, 2019 11:17     IMPRESSION AND PLAN:   *Acute COPD exacerbation with acute on chronic hypoxic respiratory failure -IV steroids - Scheduled Nebulizers - Inhalers -Wean O2 as tolerated - Consult pulmonary if no improvement  *Leukocytosis likely due to prednisone use at home.  No infiltrate on chest x-ray.  Afebrile.  Monitor.  Repeat in the morning.  *Elevated blood pressure without diagnosis of hypertension.  Likely due to acute illness and anxiety.  Monitor.  Start medications if consistently elevated.  DVT prophylaxis with Lovenox  All the records are reviewed and case discussed with ED provider. Management plans discussed with the patient, family and they are in agreement.  CODE STATUS: Full code  TOTAL TIME TAKING CARE OF THIS PATIENT: 35 minutes.   04/14/2019 Akim Watkinson M.D on 08-Mar-2019 at 2:45 PM  Between 7am to 6pm - Pager - [phone]  After 6pm go to [email or web address] - password [email or web address] [facility]  [facility]  Office  [phone]  CC: Primary care physician; [phone], MD  Note: This dictation was prepared with Dragon dictation along with smaller phrase technology. Any transcriptional errors that result from this process are unintentional.

## 2019-02-12 NOTE — Progress Notes (Signed)
Advance care planning  Purpose of Encounter Patient presented with shortness of breath with worsening COPD.  Was on BiPAP and now on oxygen. COPD exacerbation  Parties in Attendance Patient  Patients Decisional capacity Alert and oriented.  Able to make medical decisions.  No documented healthcare power of attorney.  She wants her husband to make medical decisions if she is unable to.  He had recent hip problems and was unable to drive to the emergency room.  Discussed in detail regarding COPD exacerbation.  Treatment plan , prognosis discussed.  All questions answered  CODE STATUS discussed.  Patient wishes to remain a full code with aggressive medical care.  She had gone through making decisions for her mom and understands.  Orders entered and CODE STATUS changed  Full code  Time spent - 17 minutes

## 2019-02-12 NOTE — Plan of Care (Signed)
  Problem: Education: Goal: Knowledge of disease or condition will improve Outcome: Progressing   Problem: Activity: Goal: Ability to tolerate increased activity will improve Outcome: Progressing   Problem: Respiratory: Goal: Ability to maintain a clear airway will improve Outcome: Not Progressing Goal: Levels of oxygenation will improve Outcome: Progressing Goal: Ability to maintain adequate ventilation will improve Outcome: Progressing   Problem: Coping: Goal: Level of anxiety will decrease Outcome: Progressing   Problem: Safety: Goal: Ability to remain free from injury will improve Outcome: Progressing

## 2019-02-12 NOTE — ED Triage Notes (Signed)
Arrives [facility] with c/o wheezing and SOB -- worsening last night.   Wears 2l/ [place] home oxygen prn -- initial spo2- 90%.  EMS reports 125 mg solumedrol, 2 duo nebs, 1 albuterol PTA.

## 2019-02-12 NOTE — ED Notes (Signed)
Placed on BiPap.  Tolerating well.  BIPAP 10/5 35%.

## 2019-02-13 ENCOUNTER — Inpatient Hospital Stay: Payer: Medicare Other

## 2019-02-13 LAB — MRSA PCR SCREENING: MRSA by PCR: NEGATIVE

## 2019-02-13 LAB — BLOOD GAS, ARTERIAL
Acid-Base Excess: 8.3 mmol/L — ABNORMAL HIGH (ref 0.0–2.0)
Bicarbonate: 36.4 mmol/L — ABNORMAL HIGH (ref 20.0–28.0)
FIO2: 0.35
MECHVT: 400 mL
O2 Saturation: 93.9 %
PEEP: 5 cmH2O
Patient temperature: 37
RATE: 18 resp/min
pCO2 arterial: 66 mmHg (ref 32.0–48.0)
pH, Arterial: 7.35 (ref 7.350–7.450)
pO2, Arterial: 74 mmHg — ABNORMAL LOW (ref 83.0–108.0)

## 2019-02-13 LAB — CBC
HCT: 43.5 % (ref 36.0–46.0)
Hemoglobin: 14.3 g/dL (ref 12.0–15.0)
MCH: 33.7 pg (ref 26.0–34.0)
MCHC: 32.9 g/dL (ref 30.0–36.0)
MCV: 102.6 fL — ABNORMAL HIGH (ref 80.0–100.0)
Platelets: 450 10*3/uL — ABNORMAL HIGH (ref 150–400)
RBC: 4.24 MIL/uL (ref 3.87–5.11)
RDW: 13.2 % (ref 11.5–15.5)
WBC: 12.6 10*3/uL — ABNORMAL HIGH (ref 4.0–10.5)
nRBC: 0 % (ref 0.0–0.2)

## 2019-02-13 LAB — BASIC METABOLIC PANEL
Anion gap: 8 (ref 5–15)
BUN: 19 mg/dL (ref 8–23)
CO2: 31 mmol/L (ref 22–32)
Calcium: 8.9 mg/dL (ref 8.9–10.3)
Chloride: 97 mmol/L — ABNORMAL LOW (ref 98–111)
Creatinine, Ser: 0.53 mg/dL (ref 0.44–1.00)
GFR calc Af Amer: >60 mL/min (ref >60)
GFR calc non Af Amer: >60 mL/min (ref >60)
Glucose, Bld: 156 mg/dL — ABNORMAL HIGH (ref 70–99)
Potassium: 5 mmol/L (ref 3.5–5.1)
Sodium: 136 mmol/L (ref 135–145)

## 2019-02-13 LAB — HIV ANTIBODY (ROUTINE TESTING W REFLEX): HIV Screen 4th Generation wRfx: NONREACTIVE

## 2019-02-13 LAB — GLUCOSE, CAPILLARY: Glucose-Capillary: 139 mg/dL — ABNORMAL HIGH (ref 70–99)

## 2019-02-13 MED ORDER — SODIUM CHLORIDE 0.9 % IV SOLN
INTRAVENOUS | Status: DC | PRN
  Administered 2019-02-13: 1000 mL via INTRAVENOUS

## 2019-02-13 MED ORDER — FAMOTIDINE IN NACL 20-0.9 MG/50ML-% IV SOLN
20.0000 mg | Freq: Two times a day (BID) | INTRAVENOUS | Status: DC
  Administered 2019-02-13 – 2019-02-15 (×4): 20 mg via INTRAVENOUS
  Filled 2019-02-13 (×4): qty 50

## 2019-02-13 MED ORDER — MIDAZOLAM HCL 2 MG/2ML IJ SOLN
2.0000 mg | Freq: Once | INTRAMUSCULAR | Status: AC
  Administered 2019-02-13: 20:00:00 2 mg via INTRAVENOUS

## 2019-02-13 MED ORDER — MIDAZOLAM HCL 2 MG/2ML IJ SOLN
1.0000 mg | INTRAMUSCULAR | Status: AC | PRN
  Administered 2019-02-14 – 2019-02-15 (×3): 1 mg via INTRAVENOUS
  Filled 2019-02-13 (×3): qty 2

## 2019-02-13 MED ORDER — MIDAZOLAM HCL 2 MG/2ML IJ SOLN
INTRAMUSCULAR | Status: AC
  Administered 2019-02-13: 2 mg via INTRAVENOUS
  Filled 2019-02-13: qty 2

## 2019-02-13 MED ORDER — LABETALOL HCL 5 MG/ML IV SOLN
10.0000 mg | INTRAVENOUS | Status: DC | PRN
  Administered 2019-02-13: 10 mg via INTRAVENOUS
  Filled 2019-02-13: qty 4

## 2019-02-13 MED ORDER — PROPOFOL 1000 MG/100ML IV EMUL
5.0000 ug/kg/min | INTRAVENOUS | Status: DC
  Administered 2019-02-13: 55 ug/kg/min via INTRAVENOUS
  Administered 2019-02-13: 5 ug/kg/min via INTRAVENOUS
  Administered 2019-02-14 (×2): 50 ug/kg/min via INTRAVENOUS
  Administered 2019-02-14: 40 ug/kg/min via INTRAVENOUS
  Administered 2019-02-14: 65 ug/kg/min via INTRAVENOUS
  Administered 2019-02-15: 50 ug/kg/min via INTRAVENOUS
  Administered 2019-02-15: 40 ug/kg/min via INTRAVENOUS
  Administered 2019-02-15 – 2019-02-16 (×3): 50 ug/kg/min via INTRAVENOUS
  Filled 2019-02-13 (×11): qty 100

## 2019-02-13 MED ORDER — ROCURONIUM BROMIDE 50 MG/5ML IV SOLN
INTRAVENOUS | Status: AC
  Administered 2019-02-13: 30 mg via INTRAVENOUS
  Filled 2019-02-13: qty 1

## 2019-02-13 MED ORDER — CHLORHEXIDINE GLUCONATE CLOTH 2 % EX PADS
6.0000 | MEDICATED_PAD | Freq: Every day | CUTANEOUS | Status: DC
  Administered 2019-02-13 – 2019-02-16 (×4): 6 via TOPICAL

## 2019-02-13 MED ORDER — MORPHINE SULFATE (PF) 2 MG/ML IV SOLN
INTRAVENOUS | Status: AC
  Administered 2019-02-13: 2 mg via INTRAVENOUS
  Filled 2019-02-13: qty 1

## 2019-02-13 MED ORDER — MIDAZOLAM HCL 2 MG/2ML IJ SOLN
1.0000 mg | INTRAMUSCULAR | Status: DC | PRN
  Administered 2019-02-13: 1 mg via INTRAVENOUS
  Filled 2019-02-13: qty 2

## 2019-02-13 MED ORDER — FENTANYL CITRATE (PF) 100 MCG/2ML IJ SOLN: 25.0000 ug | INTRAMUSCULAR | Status: DC | PRN

## 2019-02-13 MED ORDER — FENTANYL CITRATE (PF) 100 MCG/2ML IJ SOLN
100.0000 ug | Freq: Once | INTRAMUSCULAR | Status: AC
  Administered 2019-02-13: 12:00:00 100 ug via INTRAVENOUS

## 2019-02-13 MED ORDER — FENTANYL CITRATE (PF) 100 MCG/2ML IJ SOLN
25.0000 ug | INTRAMUSCULAR | Status: DC | PRN
  Administered 2019-02-14: 100 ug via INTRAVENOUS
  Filled 2019-02-13: qty 2

## 2019-02-13 MED ORDER — CHLORHEXIDINE GLUCONATE 0.12% ORAL RINSE (MEDLINE KIT)
15.0000 mL | Freq: Two times a day (BID) | OROMUCOSAL | Status: DC
  Administered 2019-02-13 – 2019-02-16 (×6): 15 mL via OROMUCOSAL

## 2019-02-13 MED ORDER — HYDRALAZINE HCL 20 MG/ML IJ SOLN
INTRAMUSCULAR | Status: AC
  Administered 2019-02-13: 10 mg
  Filled 2019-02-13: qty 1

## 2019-02-13 MED ORDER — ROCURONIUM BROMIDE 50 MG/5ML IV SOLN
30.0000 mg | Freq: Once | INTRAVENOUS | Status: AC
  Administered 2019-02-13: 12:00:00 30 mg via INTRAVENOUS

## 2019-02-13 MED ORDER — ORAL CARE MOUTH RINSE
15.0000 mL | OROMUCOSAL | Status: DC
  Administered 2019-02-13 – 2019-02-16 (×30): 15 mL via OROMUCOSAL

## 2019-02-13 MED ORDER — FENTANYL CITRATE (PF) 100 MCG/2ML IJ SOLN
INTRAMUSCULAR | Status: AC
  Administered 2019-02-13: 100 ug via INTRAVENOUS
  Filled 2019-02-13: qty 2

## 2019-02-13 NOTE — Progress Notes (Signed)
Called to room, pt states that she feels like she cant breathe, o2 sat 73% on bipap, respiratory called and immediately came to room, made some adjustments with oxygen/bipap, o2 sats up to 90s

## 2019-02-13 NOTE — Progress Notes (Signed)
Pt in resp distress.  On high flo 15 l now. Dr sudindi notified to come see pt.see vs. B/p elevated.

## 2019-02-13 NOTE — Progress Notes (Signed)
Called to assess patient due to low SpO2 75%. Pt. Short of breath on BIPAP with 4L O2 bleed-in. Breath sounds diminished throughout. O2 increased to 15L. With BIPAP and readjusted face mask. SpO2 improved to 94% with pt. Resting comfortably. Will continue to monitor.

## 2019-02-13 NOTE — Consult Note (Addendum)
Name: Audrey Ochoa MRN: [id number] DOB: 23-Sep-1950     CONSULTATION DATE: 02/13/2019  REFERRING MD :  Dr. Darvin Neighbours  CHIEF COMPLAINT:  Acute hypoxic respiratory distress  SIGNIFICANT EVENTS/STUDIES:  10/12 Admitted to hospital with diagnosis of acute hypoxic respiratory failure secondary to COPD exacerbation initially on BiPAP, but able to wean to 4L O2 10/13 Pt intubated due to increased WOB with severe hypoxemia failure of BIPAP  HISTORY OF PRESENT ILLNESS:   Audrey Ochoa is a 68 yo female with a past medical history of COPD emphysema with as needed oxygen at home presented to the emergency room complaining of 5 days of shortness of breath, cough and wheezing acutely worsening. Pt received multiple nebulizer treatments by EMS that did not help and was put on BiPAP in the ED. She was weaned to 4L O2 and admitted to the floor. She was covid negative. 10/13, pt became progressively short of breath and had to be transferred to the ICU for treatment. In ICU, pt was intubated for increased WOB.  PAST MEDICAL HISTORY :   has a past medical history of Emphysema of lung ([place]).  has a past surgical history that includes none. Prior to Admission medications   Medication Sig Start Date End Date Taking? Authorizing Provider  acetaminophen (TYLENOL) 500 MG tablet Take 1,000 mg by mouth every 6 (six) hours as needed.   Yes [provider]  albuterol (PROVENTIL HFA;VENTOLIN HFA) 108 (90 Base) MCG/ACT inhaler Inhale 2 puffs into the lungs every 6 (six) hours as needed for wheezing.   Yes [provider]  budesonide-formoterol (SYMBICORT) 160-4.5 MCG/ACT inhaler Inhale 2 puffs into the lungs every 12 (twelve) hours. 05/21/18  Yes Gladstone Lighter, MD  ipratropium-albuterol (DUONEB) 0.5-2.5 (3) MG/3ML SOLN Take 3 mLs by nebulization every 4 (four) hours as needed. 05/21/18  Yes Gladstone Lighter, MD  levofloxacin (LEVAQUIN) 500 MG tablet Take 500 mg by mouth daily. 02/06/19  Yes  [provider]  predniSONE (DELTASONE) 10 MG tablet 6 PO Q D X 1 DAY, THEN 5 PO Q D X 1 DAY, THEN 4 PO Q D X 1 DAY, THEN 3 PO Q D X 1 DAY, THEN 2 PO Q D X 1 DAY, THEN 1 PO Q D X 1 DAY 05/13/18  Yes [provider]  tiotropium (SPIRIVA HANDIHALER) 18 MCG inhalation capsule Place 1 tablet into inhaler and inhale daily.   Yes [provider]   Allergies  Allergen Reactions  . Penicillins   . Sulfa Antibiotics Rash    FAMILY HISTORY:  family history is not on file. SOCIAL HISTORY:  reports that she quit smoking about 8 months ago. Her smoking use included cigarettes. She has a 36.00 pack-year smoking history. She has never used smokeless tobacco. She reports current alcohol use.  REVIEW OF SYSTEMS:   Unable to obtain due to critical illness  VITAL SIGNS: Temp:  [97.1 F (36.2 C)-98.3 F (36.8 C)] 97.1 F (36.2 C) (10/13 0524) Pulse Rate:  [75-97] 95 (10/13 1000) Resp:  [13-32] 32 (10/13 1000) BP: (151-188)/(73-101) 176/101 (10/13 1000) SpO2:  [73 %-100 %] 95 % (10/13 1000) FiO2 (%):  [35 %] 35 % (10/13 1147) Weight:  [49.1 kg-53.3 kg] 49.1 kg (10/13 1000)   I/O last 3 completed shifts: In: 1050 [IV Piggyback:1050] Out: 2100 [Urine:2100] No intake/output data recorded.   SpO2: 95 % O2 Flow Rate (L/min): 15 L/min FiO2 (%): 35 %   Physical Examination:  GENERAL:critically ill appearing, intubated and sedated  HEAD: Normocephalic, atraumatic.  EYES: Pupils equal, round, reactive to light.  No scleral icterus.  MOUTH: Moist mucosal membrane. NECK: Supple. No JVD.  PULMONARY: diminished breath sounds bilaterally CARDIOVASCULAR: S1 and S2. Regular rate and rhythm. No murmurs, rubs, or gallops.  GASTROINTESTINAL: Soft, nontender, -distended. No masses. Positive bowel sounds. No hepatosplenomegaly.  MUSCULOSKELETAL: No swelling, clubbing, or edema.  NEUROLOGIC: obtunded, RASS -2 SKIN:intact,warm,dry  I personally reviewed lab work that was obtained  in last 24 hrs. CXR Independently reviewed  MEDICATIONS: I have reviewed all medications and confirmed regimen as documented   CULTURE RESULTS   Recent Results (from the past 240 hour(s))  SARS Coronavirus 2 by RT PCR (hospital order, performed in [facility] hospital lab) Nasopharyngeal Nasopharyngeal Swab     Status: None   Collection Time: 02/02/2019 10:31 AM   Specimen: Nasopharyngeal Swab  Result Value Ref Range Status   SARS Coronavirus 2 NEGATIVE NEGATIVE Final    Comment: (NOTE) If result is NEGATIVE SARS-CoV-2 target nucleic acids are NOT DETECTED. The SARS-CoV-2 RNA is generally detectable in upper and lower  respiratory specimens during the acute phase of infection. The lowest  concentration of SARS-CoV-2 viral copies this assay can detect is 250  copies / mL. A negative result does not preclude SARS-CoV-2 infection  and should not be used as the sole basis for treatment or other  patient management decisions.  A negative result may occur with  improper specimen collection / handling, submission of specimen other  than nasopharyngeal swab, presence of viral mutation(s) within the  areas targeted by this assay, and inadequate number of viral copies  (<250 copies / mL). A negative result must be combined with clinical  observations, patient history, and epidemiological information. If result is POSITIVE SARS-CoV-2 target nucleic acids are DETECTED. The SARS-CoV-2 RNA is generally detectable in upper and lower  respiratory specimens dur ing the acute phase of infection.  Positive  results are indicative of active infection with SARS-CoV-2.  Clinical  correlation with patient history and other diagnostic information is  necessary to determine patient infection status.  Positive results do  not rule out bacterial infection or co-infection with other viruses. If result is PRESUMPTIVE POSTIVE SARS-CoV-2 nucleic acids MAY BE PRESENT.   A presumptive positive result was  obtained on the submitted specimen  and confirmed on repeat testing.  While 2019 novel coronavirus  (SARS-CoV-2) nucleic acids may be present in the submitted sample  additional confirmatory testing may be necessary for epidemiological  and / or clinical management purposes  to differentiate between  SARS-CoV-2 and other Sarbecovirus currently known to infect humans.  If clinically indicated additional testing with an alternate test  methodology [phone]) is advised. The SARS-CoV-2 RNA is generally  detectable in upper and lower respiratory sp ecimens during the acute  phase of infection. The expected result is Negative. Fact Sheet for Patients:  StrictlyIdeas.no Fact Sheet for Healthcare Providers: BankingDealers.co.za This test is not yet approved or cleared by the [place] FDA and has been authorized for detection and/or diagnosis of SARS-CoV-2 by FDA under an Emergency Use Authorization (EUA).  This EUA will remain in effect (meaning this test can be used) for the duration of the COVID-19 declaration under Section 564(b)(1) of the Act, 21 U.S.C. section 360bbb-3(b)(1), unless the authorization is terminated or revoked sooner. Performed at [facility], [address], [place], [place] [address]       Indwelling Urinary Catheter continued, requirement due to   Reason to  continue Indwelling Urinary Catheter strict Intake/Output monitoring for hemodynamic instability         Ventilator continued, requirement due to severe respiratory failure   Ventilator Sedation RASS 0 to -2      ASSESSMENT AND PLAN SYNOPSIS 68 yo female admitted to the ICU for acute hypoxic respiratory failure secondary to COPD exacerbation.  Severe ACUTE Hypoxic and Hypercapnic Respiratory Failure -continue Full MV support -repeat ABG reviewed - Ventilator adjusted to increase Vmin -continue Bronchodilator Therapy -Wean Fio2 and PEEP as  tolerated -will perform SAT/SBT when respiratory parameters are met  ACUTE SEVERE COPD EXACERBATION -continue IV steroids as prescribed -continue NEB THERAPY as prescribed -morphine as needed -wean fio2 as needed and tolerated  NEUROLOGY - intubated and sedated - minimal sedation to achieve a RASS goal: -1  Leukocytosis  - Resolving, most likely due to prednisone use at home  CARDIAC ICU monitoring  GI GI PROPHYLAXIS as indicated  NUTRITIONAL STATUS DIET-->TF's tomorrow if not extubated Constipation protocol as indicated  ENDO - will use ICU hypoglycemic\Hyperglycemia protocol if needed  ELECTROLYTES -follow labs as needed -replace as needed -pharmacy consultation and following  DVT/GI PRX ordered TRANSFUSIONS AS NEEDED MONITOR FSBS ASSESS the need for LABS    Nicholas Lose, PA-S   Ottie Glazier, M.D.  Pulmonary & [place]   Critical care provider statement:    Critical care time (minutes):  109   Critical care time was exclusive of:  Separately billable procedures and  treating other patients   Critical care was necessary to treat or prevent imminent or  life-threatening deterioration of the following conditions:   Acute hyper And hypoxemic respiratory failure, acute severe COPD exacerbation, multiple comorbid conditions   Critical care was time spent personally by me on the following  activities:  Development of treatment plan with patient or surrogate,  discussions with consultants, evaluation of patient's response to  treatment, examination of patient, obtaining history from patient or  surrogate, ordering and performing treatments and interventions, ordering  and review of laboratory studies and re-evaluation of patient's condition   I assumed direction of critical care for this patient from another  provider in my specialty: no

## 2019-02-13 NOTE — Progress Notes (Signed)
Dr sudini in to see pt.  Will move pt to  icu stepdown.  resp  Transferring to bipap.  Dr  sudini spoke with pt regarding move.

## 2019-02-13 NOTE — Progress Notes (Signed)
Pt to go to [facility] 17 report called to cheryl green rn. Will transport now.

## 2019-02-13 NOTE — Procedures (Signed)
Endotracheal Intubation: Patient required placement of an artificial airway secondary to Respiratory Failure  Consent: Emergent.   Hand washing performed prior to starting the procedure.   Medications administered for sedation prior to procedure:  ,  Rocuronium 30 mg IV, Fentanyl 100 mcg IV.    A time out procedure was called and correct patient, name, & ID confirmed. Needed supplies and equipment were assembled and checked to include ETT, 10 ml syringe, Glidescope, Mac and Miller blades, suction, oxygen and bag mask valve, end tidal CO2 monitor.   Patient was positioned to align the mouth and pharynx to facilitate visualization of the glottis.   Heart rate, SpO2 and blood pressure was continuously monitored during the procedure. Pre-oxygenation was conducted prior to intubation and endotracheal tube was placed through the vocal cords into the trachea.     The artificial airway was placed under direct visualization via glidescope route using a 7.5 ETT on the first attempt.  ETT was secured at 23 cm mark.  Placement was confirmed by auscuitation of lungs with good breath sounds bilaterally and no stomach sounds.  Condensation was noted on endotracheal tube.   Pulse ox 98%.  CO2 detector in place with appropriate color change.   Complications: None .     Chest radiograph ordered and pending.   Comments: OGT placed via glidescope.   Ottie Glazier, M.D.  Pulmonary & [place]

## 2019-02-13 NOTE — Progress Notes (Signed)
[facility] - [place] [facility] at [facility]   PATIENT NAME: Audrey Ochoa    MR#:  [id number]  DATE OF BIRTH:  07-10-1950  SUBJECTIVE:  CHIEF COMPLAINT:   Chief Complaint  Patient presents with  . Respiratory Distress   Worsening respiratory distress.  Was on BiPAP through the night.  Transition to high flow nasal cannula and still struggling to breathe.  REVIEW OF SYSTEMS:    Review of Systems  Constitutional: Positive for malaise/fatigue. Negative for chills and fever.  HENT: Negative for sore throat.   Eyes: Negative for blurred vision, double vision and pain.  Respiratory: Positive for cough, shortness of breath and wheezing. Negative for hemoptysis.   Cardiovascular: Negative for chest pain, palpitations, orthopnea and leg swelling.  Gastrointestinal: Negative for abdominal pain, constipation, diarrhea, heartburn, nausea and vomiting.  Genitourinary: Negative for dysuria and hematuria.  Musculoskeletal: Negative for back pain and joint pain.  Skin: Negative for rash.  Neurological: Negative for sensory change, speech change, focal weakness and headaches.  Endo/Heme/Allergies: Does not bruise/bleed easily.  Psychiatric/Behavioral: Negative for depression. The patient is not nervous/anxious.     DRUG ALLERGIES:   Allergies  Allergen Reactions  . Penicillins   . Sulfa Antibiotics Rash    VITALS:  Blood pressure (!) 176/101, pulse 95, temperature (!) 97.1 F (36.2 C), temperature source Axillary, resp. rate (!) 32, height 5\' 1"  (1.549 m), weight 49.1 kg, SpO2 95 %.  PHYSICAL EXAMINATION:   Physical Exam  GENERAL:  68 y.o.-year-old patient lying in the bed with significant respiratory distress EYES: Pupils equal, round, reactive to light and accommodation. No scleral icterus. Extraocular muscles intact.  HEENT: Head atraumatic, normocephalic. Oropharynx and nasopharynx clear.  NECK:  Supple, no jugular venous distention. No thyroid enlargement, no  tenderness.  LUNGS: Decreased air entry with accessory muscle use CARDIOVASCULAR: S1, S2 normal. No murmurs, rubs, or gallops.  ABDOMEN: Soft, nontender, nondistended. Bowel sounds present. No organomegaly or mass.  EXTREMITIES: No cyanosis, clubbing or edema b/l.    NEUROLOGIC: Cranial nerves II through XII are intact. No focal Motor or sensory deficits b/l.   PSYCHIATRIC: The patient is alert and oriented x 3.  SKIN: No obvious rash, lesion, or ulcer.   LABORATORY PANEL:   CBC Recent Labs  Lab 02/13/19 0621  WBC 12.6*  HGB 14.3  HCT 43.5  PLT 450*   ------------------------------------------------------------------------------------------------------------------ Chemistries  Recent Labs  Lab 02/13/19 0621  NA 136  K 5.0  CL 97*  CO2 31  GLUCOSE 156*  BUN 19  CREATININE 0.53  CALCIUM 8.9   ------------------------------------------------------------------------------------------------------------------  Cardiac Enzymes No results for input(s): TROPONINI in the last 168 hours. ------------------------------------------------------------------------------------------------------------------  RADIOLOGY:  Dg Chest Portable 1 View  Result Date: 02/21/2019 CLINICAL DATA:  Shortness of breath EXAM: PORTABLE CHEST 1 VIEW COMPARISON:  05/18/2018 FINDINGS: There is hyperinflation of the lungs compatible with COPD. Heart and mediastinal contours are within normal limits. No focal opacities or effusions. No acute bony abnormality. IMPRESSION: COPD.  No active disease. Electronically Signed   By: 05/20/2018 M.D.   On: 02/11/2019 11:17     ASSESSMENT AND PLAN:   *Acute COPD exacerbation with acute on chronic hypoxic respiratory failure On IV steroids, scheduled nebulizers and inhalers.   Worsened significantly and presently extremely short of breath and will need BiPAP support and possible intubation.  Discussed with pulmonary.  Will transfer to ICU.  Critically ill.   Discussed with patient.  *Leukocytosis likely due to prednisone use at home.  No infiltrate on chest x-ray.  Afebrile.  Improving  *Elevated blood pressure without diagnosis of hypertension.  Likely due to stress and acute illness. No meds at this time.  DVT prophylaxis with Lovenox  All the records are reviewed and case discussed with Care Management/Social Worker Management plans discussed with the patient, family and they are in agreement.  CODE STATUS: Full code  TOTAL CRITICAL CARE TIME TAKING CARE OF THIS PATIENT: 35 minutes.   Leia Alf Yevonne Yokum M.D on 02/13/2019 at 11:24 AM  Between 7am to 6pm - Pager - [phone]  After 6pm go to [email or web address] - password [email or web address] [facility]  Office  [phone]  CC: Primary care physician; Theotis Burrow, MD  Note: This dictation was prepared with Dragon dictation along with smaller phrase technology. Any transcriptional errors that result from this process are unintentional.

## 2019-02-13 NOTE — Progress Notes (Signed)
NP Ouma made aware of pts BP, PRN med ordered

## 2019-02-13 NOTE — Progress Notes (Signed)
After breathing treatment tried to place patient on HFNC 15lpm.  Patient not tolerating.  Spoke with RN who called physician to come see pt.  Patient placed back on BIPAP and will be transferred to step down.

## 2019-02-14 ENCOUNTER — Inpatient Hospital Stay: Payer: Medicare Other

## 2019-02-14 LAB — BLOOD GAS, ARTERIAL
Acid-Base Excess: 11.2 mmol/L — ABNORMAL HIGH (ref 0.0–2.0)
Bicarbonate: 35.1 mmol/L — ABNORMAL HIGH (ref 20.0–28.0)
FIO2: 0.24
MECHVT: 400 mL
O2 Saturation: 96.4 %
PEEP: 5 cmH2O
Patient temperature: 37
RATE: 20 resp/min
pCO2 arterial: 42 mmHg (ref 32.0–48.0)
pH, Arterial: 7.53 — ABNORMAL HIGH (ref 7.350–7.450)
pO2, Arterial: 75 mmHg — ABNORMAL LOW (ref 83.0–108.0)

## 2019-02-14 LAB — BASIC METABOLIC PANEL
Anion gap: 9 (ref 5–15)
BUN: 33 mg/dL — ABNORMAL HIGH (ref 8–23)
CO2: 32 mmol/L (ref 22–32)
Calcium: 8.9 mg/dL (ref 8.9–10.3)
Chloride: 96 mmol/L — ABNORMAL LOW (ref 98–111)
Creatinine, Ser: 0.79 mg/dL (ref 0.44–1.00)
GFR calc Af Amer: >60 mL/min (ref >60)
GFR calc non Af Amer: >60 mL/min (ref >60)
Glucose, Bld: 106 mg/dL — ABNORMAL HIGH (ref 70–99)
Potassium: 4.1 mmol/L (ref 3.5–5.1)
Sodium: 137 mmol/L (ref 135–145)

## 2019-02-14 LAB — CBC
HCT: 40.7 % (ref 36.0–46.0)
Hemoglobin: 13.3 g/dL (ref 12.0–15.0)
MCH: 33.6 pg (ref 26.0–34.0)
MCHC: 32.7 g/dL (ref 30.0–36.0)
MCV: 102.8 fL — ABNORMAL HIGH (ref 80.0–100.0)
Platelets: 399 10*3/uL (ref 150–400)
RBC: 3.96 MIL/uL (ref 3.87–5.11)
RDW: 13.3 % (ref 11.5–15.5)
WBC: 15.8 10*3/uL — ABNORMAL HIGH (ref 4.0–10.5)
nRBC: 0 % (ref 0.0–0.2)

## 2019-02-14 MED ORDER — SODIUM CHLORIDE 0.9 % IV SOLN
0.0000 ug/min | INTRAVENOUS | Status: DC
  Filled 2019-02-14: qty 1

## 2019-02-14 MED ORDER — IPRATROPIUM-ALBUTEROL 0.5-2.5 (3) MG/3ML IN SOLN
3.0000 mL | Freq: Three times a day (TID) | RESPIRATORY_TRACT | Status: DC
  Administered 2019-02-14 – 2019-02-16 (×7): 3 mL via RESPIRATORY_TRACT
  Filled 2019-02-14 (×7): qty 3

## 2019-02-14 NOTE — Progress Notes (Addendum)
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 Pt received multiple nebulizer treatments by EMS that did not help and was put on BiPAP in the ED. She was weaned to 4L O2 and admitted to the floor. She was covid negative. 10/13, pt became progressively short of breath and had to be transferred to the ICU for treatment. In ICU, pt was intubated for increased WOB 10/13.  PAST MEDICAL HISTORY :   has a past medical history of Emphysema of lung ([place]).  has a past surgical history that includes none. Prior to Admission medications   Medication Sig Start Date End Date Taking? Authorizing Provider  acetaminophen (TYLENOL) 500 MG tablet Take 1,000 mg by mouth every 6 (six) hours as needed.   Yes [provider]  albuterol (PROVENTIL HFA;VENTOLIN HFA) 108 (90 Base) MCG/ACT inhaler Inhale 2 puffs into the lungs every 6 (six) hours as needed for wheezing.   Yes [provider]  budesonide-formoterol (SYMBICORT) 160-4.5 MCG/ACT inhaler Inhale 2 puffs into the lungs every 12 (twelve) hours. 05/21/18  Yes Gladstone Lighter, MD  ipratropium-albuterol (DUONEB) 0.5-2.5 (3) MG/3ML SOLN Take 3 mLs by nebulization every 4 (four) hours as needed. 05/21/18  Yes Gladstone Lighter, MD  levofloxacin (LEVAQUIN) 500 MG tablet Take 500  mg by mouth daily. 02/06/19  Yes [provider]  predniSONE (DELTASONE) 10 MG tablet 6 PO Q D X 1 DAY, THEN 5 PO Q D X 1 DAY, THEN 4 PO Q D X 1 DAY, THEN 3 PO Q D X 1 DAY, THEN 2 PO Q D X 1 DAY, THEN 1 PO Q D X 1 DAY 05/13/18  Yes [provider]  tiotropium (SPIRIVA HANDIHALER) 18 MCG inhalation capsule Place 1 tablet into inhaler and inhale daily.   Yes [provider]   Allergies  Allergen Reactions  . Penicillins   . Sulfa Antibiotics Rash    REVIEW OF SYSTEMS:   Unable to obtain due to critical illness   VITAL SIGNS: Temp:  [97.8 F (36.6 C)-98.1 F (36.7 C)] 97.8 F (36.6 C) (10/14 0800) Pulse Rate:  [68-105] 73 (10/14 0700) Resp:  [18-32] 20 (10/14 0700) BP: (72-176)/(56-101) 80/60 (10/14 0700) SpO2:  [81 %-96 %] 94 % (10/14 0752) FiO2 (%):  [24 %-35 %] 24 % (10/14 0800) Weight:  [49.1 kg] 49.1 kg (10/13 1000)   I/O last 3 completed shifts: In: 527.3 [I.V.:477.3; IV Piggyback:50] Out: 2275 [Urine:2275] Total I/O In: 40 [NG/GT:40] Out: 275 [Urine:75; Emesis/NG output:200]   SpO2: 94 % O2 Flow Rate (L/min): 15 L/min FiO2 (%): 24 %  Vent Mode: PRVC FiO2 (%):  [24 %-35 %] 24 % Set Rate:  [18 bmp-20 bmp] 20 bmp Vt Set:  [400 mL] 400 mL PEEP:  [5 cmH20] 5 cmH20 Plateau Pressure:  [26 cmH20] 26 cmH20   Physical Examination:  GENERAL:critically ill appearing, intubated and sedated-follows verbal commands HEAD: Normocephalic, atraumatic.  EYES: Pupils equal, round, reactive to light.  No scleral icterus.  MOUTH: Moist mucosal membrane. NECK: Supple. No JVD.  PULMONARY: diminished breath sounds bilaterally CARDIOVASCULAR: S1 and S2. Regular rate and rhythm. No murmurs, rubs, or gallops.  GASTROINTESTINAL: Soft, nontender, -distended. No masses. Positive bowel sounds. No hepatosplenomegaly.  MUSCULOSKELETAL: No swelling, clubbing, or edema.  NEUROLOGIC:  RASS -0 SKIN:intact,warm,dry  I personally reviewed lab work that was obtained in  last 24 hrs. CXR Independently reviewed   MEDICATIONS: I have reviewed all medications and confirmed regimen as documented   CULTURE RESULTS   Recent Results (from the past 240 hour(s))  SARS Coronavirus 2 by RT PCR (hospital order, performed in [facility] hospital lab) Nasopharyngeal Nasopharyngeal Swab     Status: None   Collection Time: 02/26/2019 10:31 AM   Specimen: Nasopharyngeal Swab  Result Value Ref Range Status   SARS Coronavirus 2 NEGATIVE NEGATIVE Final    Comment: (NOTE) If result is NEGATIVE SARS-CoV-2 target nucleic acids are NOT DETECTED. The SARS-CoV-2 RNA is generally detectable in upper and lower  respiratory specimens during the acute phase of infection. The lowest  concentration of SARS-CoV-2 viral copies this assay can detect is 250  copies / mL. A negative result does not preclude SARS-CoV-2 infection  and should not be used as the sole basis for treatment or other  patient management decisions.  A negative result may occur with  improper specimen collection / handling, submission of specimen other  than nasopharyngeal swab, presence of viral mutation(s) within the  areas targeted by this assay, and inadequate number of viral copies  (<250 copies / mL). A negative result must be combined with clinical  observations, patient history, and epidemiological information. If result is POSITIVE SARS-CoV-2 target nucleic acids are DETECTED. The SARS-CoV-2 RNA is generally detectable in upper and lower  respiratory specimens dur ing the acute phase of infection.  Positive  results are indicative of active infection with SARS-CoV-2.  Clinical  correlation with patient history and other diagnostic information is  necessary to determine patient infection status.  Positive results do  not rule out bacterial infection or co-infection with other viruses. If result is PRESUMPTIVE POSTIVE SARS-CoV-2 nucleic acids MAY BE PRESENT.   A presumptive positive result was obtained  on the submitted specimen  and confirmed on repeat testing.  While 2019 novel coronavirus  (SARS-CoV-2) nucleic acids may be present in the submitted sample  additional confirmatory testing may be necessary for epidemiological  and / or clinical management purposes  to differentiate between  SARS-CoV-2 and other Sarbecovirus currently known to infect humans.  If clinically indicated additional testing with an alternate test  methodology [phone]) is advised. The SARS-CoV-2 RNA is generally  detectable in upper and lower respiratory sp ecimens during the acute  phase of infection. The expected result is Negative. Fact Sheet for Patients:  StrictlyIdeas.no Fact Sheet for Healthcare Providers: BankingDealers.co.za This test is not yet approved or cleared by the [place] FDA and has been authorized for detection and/or diagnosis of SARS-CoV-2 by FDA under an Emergency Use Authorization (EUA).  This EUA will remain in effect (meaning this test can be used) for the duration of the COVID-19 declaration under Section 564(b)(1) of the Act, 21 U.S.C. section 360bbb-3(b)(1), unless the authorization is terminated or revoked sooner. Performed at [facility], [address], [place], [place] [address]   MRSA PCR Screening  Status: None   Collection Time: 02/13/19 10:31 AM   Specimen: Nasal Mucosa; Nasopharyngeal  Result Value Ref Range Status   MRSA by PCR NEGATIVE NEGATIVE Final    Comment:        The GeneXpert MRSA Assay (FDA approved for NASAL specimens only), is one component of a comprehensive MRSA colonization surveillance program. It is not intended to diagnose MRSA infection nor to guide or monitor treatment for MRSA infections. Performed at [facility], [place], [place] [address]           IMAGING    Dg Abd 1 View  Result Date: 02/13/2019 CLINICAL DATA:  Encounter for tube  placement EXAM: ABDOMEN - 1 VIEW COMPARISON:  Abdominal radiograph 05/15/2018 FINDINGS: Interval placement of a nasogastric tube with side port projecting over the stomach. No dilated loops of bowel in the partially visualized abdomen to suggest obstruction. No evidence of free air. Severe degenerative changes in the lumbar spine. IMPRESSION: Appropriate positioning of the nasogastric tube with side port projecting over the stomach. Electronically Signed   By: Audie Pinto M.D.   On: 02/13/2019 12:51   Dg Chest Port 1 View  Result Date: 02/14/2019 CLINICAL DATA:  Acute respiratory failure EXAM: PORTABLE CHEST 1 VIEW COMPARISON:  Radiograph 02/13/2019 FINDINGS: Endotracheal tube in the mid trachea, 3.3 cm from the carina. Transesophageal tube tip and side port are distal to the GE junction. Hyperinflation of the lungs with chronic interstitial opacities and chronic bronchitic changes. No new consolidative process or features of edema. No pneumothorax or effusion. No acute osseous or soft tissue abnormality. Degenerative changes are present in the imaged spine and shoulders. IMPRESSION: Satisfactory positioning of the endotracheal and transesophageal tubes. No significant interval change from 1 day prior. Chronic bronchitic changes and features of emphysema. Electronically Signed   By: Lovena Le M.D.   On: 02/14/2019 04:48   Dg Chest Port 1 View  Result Date: 02/13/2019 CLINICAL DATA:  Encounter for tube placement EXAM: PORTABLE CHEST 1 VIEW COMPARISON:  Chest radiograph 02/23/2019 FINDINGS: Interval placement of an endotracheal tube with tip projecting between the thoracic inlet and carina. Interval placement of a nasogastric tube with side port projecting over the stomach. Lungs remain hyperinflated consistent with COPD. Bilateral diffuse coarse interstitial opacities consistent with chronic bronchitic change. Emphysema. No new focal infiltrate. No pneumothorax or large pleural effusion. Visualized  skeleton unremarkable. IMPRESSION: Appropriate positioning of the endotracheal tube and nasogastric tube. Emphysema and COPD. Electronically Signed   By: Audie Pinto M.D.   On: 02/13/2019 12:49        Indwelling Urinary Catheter continued, requirement due to   Reason to continue Indwelling Urinary Catheter strict Intake/Output monitoring for hemodynamic instability         Ventilator continued, requirement due to severe respiratory failure   Ventilator Sedation RASS  -2      ASSESSMENT AND PLAN  68 yo female admitted to the ICU for acute hypoxic respiratory failure secondary to COPD exacerbation requiring emergent intubation.  Severe ACUTE Hypoxic and Hypercapnic Respiratory Failure -continue Full MV support -repeat ABG reviewed - Ventilator adjusted to increase Vmin -continue Bronchodilator Therapy -Wean Fio2 and PEEP as tolerated -will perform SAT/SBT when respiratory parameters are met  ACUTE SEVERE COPD EXACERBATION -continue IV steroids as prescribed -continue NEB THERAPY as prescribed -COPD carepath -wean fio2 as needed and tolerated   NEUROLOGY - intubated and sedated - minimal sedation to achieve a RASS goal: -1 Wake up assessment pending  Leukocytosis  - Resolving, most likely due to gluccocorticoids  CARDIAC ICU monitoring  GI GI PROPHYLAXIS as indicated  NUTRITIONAL STATUS DIET-->TF's as tolerated Constipation protocol as indicated  ENDO - will use ICU hypoglycemic\Hyperglycemia protocol if needed  ELECTROLYTES -follow labs as needed -replace as needed -pharmacy consultation and following  DVT/GI PRX ordered TRANSFUSIONS AS NEEDED MONITOR FSBS ASSESS the need for LABS   Jacques Navy, PA-S  Ottie Glazier, M.D.  Pulmonary & [facility]   Critical care provider statement:   Critical care time (minutes): 33  Critical care time was exclusive of: Separately billable procedures and   treating other patients  Critical care was necessary to treat or prevent imminent or  life-threatening deterioration of the following conditions:  Acute hyper And hypoxemic respiratory failure, acute severe COPD exacerbation, multiple comorbid conditions  Critical care was time spent personally by me on the following  activities: Development of treatment plan with patient or surrogate,  discussions with consultants, evaluation of patient's response to  treatment, examination of patient, obtaining history from patient or  surrogate, ordering and performing treatments and interventions, ordering  and review of laboratory studies and re-evaluation of patient's condition  I assumed direction of critical care for this patient from another  provider in my specialty: no

## 2019-02-14 NOTE — Progress Notes (Addendum)
RSBI 20  Pt weaned PSV 10/5 24% approx 30 min before she became tachypnic with increase in agitation. Pt placed back on resting settings. RN increased sedation. Pt appears to be resting comfortably on vent

## 2019-02-14 NOTE — Progress Notes (Signed)
[employer] [facility] - [place] at [facility]   PATIENT NAME: Audrey Ochoa    MR#:  [id number]  DATE OF BIRTH:  07-11-1950  SUBJECTIVE:  CHIEF COMPLAINT:   Chief Complaint  Patient presents with  . Respiratory Distress   Intubated on full vent suppost Sedated  Intubated 02/13/2019  REVIEW OF SYSTEMS:    Review of Systems  Constitutional: Positive for malaise/fatigue. Negative for chills and fever.  HENT: Negative for sore throat.   Eyes: Negative for blurred vision, double vision and pain.  Respiratory: Positive for cough, shortness of breath and wheezing. Negative for hemoptysis.   Cardiovascular: Negative for chest pain, palpitations, orthopnea and leg swelling.  Gastrointestinal: Negative for abdominal pain, constipation, diarrhea, heartburn, nausea and vomiting.  Genitourinary: Negative for dysuria and hematuria.  Musculoskeletal: Negative for back pain and joint pain.  Skin: Negative for rash.  Neurological: Negative for sensory change, speech change, focal weakness and headaches.  Endo/Heme/Allergies: Does not bruise/bleed easily.  Psychiatric/Behavioral: Negative for depression. The patient is not nervous/anxious.     DRUG ALLERGIES:   Allergies  Allergen Reactions  . Penicillins   . Sulfa Antibiotics Rash    VITALS:  Blood pressure 90/65, pulse 74, temperature 98 F (36.7 C), temperature source Axillary, resp. rate 20, height 5' 0.98" (1.549 m), weight 49.1 kg, SpO2 95 %.  PHYSICAL EXAMINATION:   Physical Exam  GENERAL:  68 y.o.-year-old patient lying in the bed with significant respiratory distress EYES: Pupils equal, round, reactive to light and accommodation. No scleral icterus. Extraocular muscles intact.  HEENT: Head atraumatic, normocephalic. Oropharynx and nasopharynx clear.  NECK:  Supple, no jugular venous distention. No thyroid enlargement, no tenderness.  LUNGS: No wheezing CARDIOVASCULAR: S1, S2 normal. No murmurs, rubs, or gallops.   ABDOMEN: Soft, nontender, nondistended. Bowel sounds present. No organomegaly or mass.  EXTREMITIES: No cyanosis, clubbing or edema b/l.    NEUROLOGIC: Sedated PSYCHIATRIC: The patient is sedated SKIN: No obvious rash, lesion, or ulcer.   LABORATORY PANEL:   CBC Recent Labs  Lab 02/14/19 0522  WBC 15.8*  HGB 13.3  HCT 40.7  PLT 399   ------------------------------------------------------------------------------------------------------------------ Chemistries  Recent Labs  Lab 02/14/19 0522  NA 137  K 4.1  CL 96*  CO2 32  GLUCOSE 106*  BUN 33*  CREATININE 0.79  CALCIUM 8.9   ------------------------------------------------------------------------------------------------------------------  Cardiac Enzymes No results for input(s): TROPONINI in the last 168 hours. ------------------------------------------------------------------------------------------------------------------  RADIOLOGY:  Dg Abd 1 View  Result Date: 02/13/2019 CLINICAL DATA:  Encounter for tube placement EXAM: ABDOMEN - 1 VIEW COMPARISON:  Abdominal radiograph 05/15/2018 FINDINGS: Interval placement of a nasogastric tube with side port projecting over the stomach. No dilated loops of bowel in the partially visualized abdomen to suggest obstruction. No evidence of free air. Severe degenerative changes in the lumbar spine. IMPRESSION: Appropriate positioning of the nasogastric tube with side port projecting over the stomach. Electronically Signed   By: Emmaline Kluver M.D.   On: 02/13/2019 12:51   Dg Chest Port 1 View  Result Date: 02/14/2019 CLINICAL DATA:  Acute respiratory failure EXAM: PORTABLE CHEST 1 VIEW COMPARISON:  Radiograph 02/13/2019 FINDINGS: Endotracheal tube in the mid trachea, 3.3 cm from the carina. Transesophageal tube tip and side port are distal to the GE junction. Hyperinflation of the lungs with chronic interstitial opacities and chronic bronchitic changes. No new consolidative  process or features of edema. No pneumothorax or effusion. No acute osseous or soft tissue abnormality. Degenerative changes are present in the imaged  spine and shoulders. IMPRESSION: Satisfactory positioning of the endotracheal and transesophageal tubes. No significant interval change from 1 day prior. Chronic bronchitic changes and features of emphysema. Electronically Signed   By: Lovena Le M.D.   On: 02/14/2019 04:48   Dg Chest Port 1 View  Result Date: 02/13/2019 CLINICAL DATA:  Encounter for tube placement EXAM: PORTABLE CHEST 1 VIEW COMPARISON:  Chest radiograph 02/24/2019 FINDINGS: Interval placement of an endotracheal tube with tip projecting between the thoracic inlet and carina. Interval placement of a nasogastric tube with side port projecting over the stomach. Lungs remain hyperinflated consistent with COPD. Bilateral diffuse coarse interstitial opacities consistent with chronic bronchitic change. Emphysema. No new focal infiltrate. No pneumothorax or large pleural effusion. Visualized skeleton unremarkable. IMPRESSION: Appropriate positioning of the endotracheal tube and nasogastric tube. Emphysema and COPD. Electronically Signed   By: Audie Pinto M.D.   On: 02/13/2019 12:49     ASSESSMENT AND PLAN:   *Acute COPD exacerbation with acute on chronic hypoxic respiratory failure On IV steroids, scheduled nebulizers and inhalers. Continue full vent support  *Leukocytosis likely due to prednisone use at home.  No infiltrate on chest x-ray.  Afebrile.  Improving  *Elevated blood pressure without diagnosis of hypertension.  Likely due to stress and acute illness. No meds at this time.  DVT prophylaxis with Lovenox  All the records are reviewed and case discussed with Care Management/Social Worker Management plans discussed with the patient, family and they are in agreement.  CODE STATUS: Full code  TOTALTIME TAKING CARE OF THIS PATIENT: 35 minutes.   Leia Alf Caryn Gienger  M.D on 02/14/2019 at 3:28 PM  Between 7am to 6pm - Pager - [phone]  After 6pm go to [email or web address] - password [email or web address] [facility]  Office  [phone]  CC: Primary care physician; Theotis Burrow, MD  Note: This dictation was prepared with Dragon dictation along with smaller phrase technology. Any transcriptional errors that result from this process are unintentional.

## 2019-02-14 NOTE — Progress Notes (Signed)
Initial Nutrition Assessment  DOCUMENTATION CODES:   Severe malnutrition in context of chronic illness  INTERVENTION:  If patient expected to remain intubated >24-48 hours recommend initiating Vital High Protein at 20 mL/hr (480 mL goal daily volume) + Pro-Stat 30 mL BID per tube. Provides 680 kcal, 72 grams of protein, 403 mL H2O daily. With current propofol rate provides 1303 kcal daily.  If tube feeds are initiated provide liquid MVI daily per tube and a minimum free water flush of 30 ml Q4hrs.  Monitor magnesium, potassium, and phosphorus daily for at least 3 days, MD to replete as needed, as pt is at risk for refeeding syndrome given severe malnutrition.  NUTRITION DIAGNOSIS:   Severe Malnutrition related to chronic illness(emphysema) as evidenced by severe fat depletion, severe muscle depletion.  GOAL:   Provide needs based on ASPEN/SCCM guidelines  MONITOR:   Vent status, Labs, Weight trends, Skin, I & O's  REASON FOR ASSESSMENT:   Ventilator    ASSESSMENT:   68 year old female with PMHx of emphysema admitted with acute hypoxic respiratory failure secondary to COPD exacerbation initially on BiPAP but required intubation on 10/13.   Patient intubated and sedated. On PRVC mode with FiO2 30% and PEEP 5 cmH2O. Abdomen soft.   Enteral Access: 16 Fr. OGT placed 10/13; terminates in stomach per abdominal x-ray 10/13; 53 cm at corner of mouth  MAP: 68-97 mmHg  Patient is currently intubated on ventilator support Ve: 8 L/min Temp (24hrs), Avg:98 F (36.7 C), Min:97.8 F (36.6 C), Max:98.1 F (36.7 C)  Propofol: 23.6 mL/hr (623 kcal daily)  Medications reviewed and include: Solu-Medrol 60 mg Q12hrs IV, famotidine, propofol gtt.  Labs reviewed: Chloride 96, BUN 33.  I/O: 875 mL UOP yesterday (0.7 mL/kg/hr)  Discussed with RN and on rounds today.  NUTRITION - FOCUSED PHYSICAL EXAM:    Most Recent Value  Orbital Region  Severe depletion  Upper Arm Region  Severe  depletion  Thoracic and Lumbar Region  Moderate depletion  Buccal Region  Unable to assess  Temple Region  Severe depletion  Clavicle Bone Region  Moderate depletion  Clavicle and Acromion Bone Region  Severe depletion  Scapular Bone Region  Unable to assess  Dorsal Hand  Severe depletion  Patellar Region  Severe depletion  Anterior Thigh Region  Severe depletion  Posterior Calf Region  Severe depletion  Edema (RD Assessment)  None  Hair  Reviewed  Eyes  Unable to assess  Mouth  Unable to assess  Skin  Reviewed  Nails  Reviewed     Diet Order:   Diet Order    None     EDUCATION NEEDS:   No education needs have been identified at this time  Skin:  Skin Assessment: Reviewed RN Assessment  Last BM:  02/11/2019 per chart  Height:   Ht Readings from Last 1 Encounters:  02/14/19 5' 0.98" (1.549 m)   Weight:   Wt Readings from Last 1 Encounters:  02/13/19 49.1 kg   Ideal Body Weight:  47.8 kg  BMI:  Body mass index is 20.46 kg/m.  Estimated Nutritional Needs:   Kcal:  1228 (25 kcal/kg)  Protein:  74-88 grams (1.5-1.8 grams/kg)  Fluid:  1.2-1.5 L/day  Willey Blade, MS, RD, LDN Office: [phone] Pager: [phone] After Hours/Weekend Pager: [phone]

## 2019-02-15 DIAGNOSIS — Z515 Encounter for palliative care: Secondary | ICD-10-CM | POA: Diagnosis not present

## 2019-02-15 DIAGNOSIS — Z7189 Other specified counseling: Secondary | ICD-10-CM

## 2019-02-15 DIAGNOSIS — J9601 Acute respiratory failure with hypoxia: Secondary | ICD-10-CM

## 2019-02-15 DIAGNOSIS — E43 Unspecified severe protein-calorie malnutrition: Secondary | ICD-10-CM | POA: Insufficient documentation

## 2019-02-15 LAB — BASIC METABOLIC PANEL
Anion gap: 11 (ref 5–15)
BUN: 35 mg/dL — ABNORMAL HIGH (ref 8–23)
CO2: 32 mmol/L (ref 22–32)
Calcium: 8.9 mg/dL (ref 8.9–10.3)
Chloride: 97 mmol/L — ABNORMAL LOW (ref 98–111)
Creatinine, Ser: 0.67 mg/dL (ref 0.44–1.00)
GFR calc Af Amer: >60 mL/min (ref >60)
GFR calc non Af Amer: >60 mL/min (ref >60)
Glucose, Bld: 115 mg/dL — ABNORMAL HIGH (ref 70–99)
Potassium: 3.9 mmol/L (ref 3.5–5.1)
Sodium: 140 mmol/L (ref 135–145)

## 2019-02-15 LAB — CBC
HCT: 41.9 % (ref 36.0–46.0)
Hemoglobin: 13.9 g/dL (ref 12.0–15.0)
MCH: 33.5 pg (ref 26.0–34.0)
MCHC: 33.2 g/dL (ref 30.0–36.0)
MCV: 101 fL — ABNORMAL HIGH (ref 80.0–100.0)
Platelets: 405 10*3/uL — ABNORMAL HIGH (ref 150–400)
RBC: 4.15 MIL/uL (ref 3.87–5.11)
RDW: 13.3 % (ref 11.5–15.5)
WBC: 11.5 10*3/uL — ABNORMAL HIGH (ref 4.0–10.5)
nRBC: 0 % (ref 0.0–0.2)

## 2019-02-15 LAB — TRIGLYCERIDES: Triglycerides: 264 mg/dL — ABNORMAL HIGH (ref <150)

## 2019-02-15 MED ORDER — PANTOPRAZOLE SODIUM 40 MG IV SOLR
40.0000 mg | Freq: Two times a day (BID) | INTRAVENOUS | Status: DC
  Administered 2019-02-15 – 2019-02-16 (×2): 40 mg via INTRAVENOUS
  Filled 2019-02-15 (×2): qty 40

## 2019-02-15 NOTE — Progress Notes (Signed)
Name: Audrey Ochoa MRN: [id number] DOB: [birth year]     CONSULTATION DATE: 02/14/2019  CHIEF COMPLAINT:  Acute hypoxic respiratory distress  SIGNIFICANT EVENTS/STUDIES:  10/12 Admitted to hospital with diagnosis of acute hypoxic respiratory failure secondary to COPD exacerbation initially on BiPAP, but able to wean to 4L O2 10/13 Pt intubated due to increased WOB with severe hypoxemia failure of BIPAP 10/14 - Failed weaning trial , immediate resp distress 10/15-family conference with daughter, recommend hospice care due to end-stage COPD with severe bullous emphysema and active smoking.   HISTORY OF PRESENT ILLNESS:   Audrey Ochoa is a 68 yo female with a past medical history of COPD emphysema with as needed oxygen at home presented to the emergency room complaining of 5 days of shortness of breath, cough and wheezing acutely worsening. Pt received multiple nebulizer treatments by EMS that did not help and was put on BiPAP in the ED. She was weaned to 4L O2 and admitted to the floor. She was covid negative. 10/13, pt became progressively short of breath and had to be transferred to the ICU for treatment. In ICU, pt was intubated for increased WOB 10/13.  PAST MEDICAL HISTORY :   has a past medical history of Emphysema of lung ([place]).  has a past surgical history that includes none. Prior to Admission medications   Medication Sig Start Date End Date Taking? Authorizing Provider  acetaminophen (TYLENOL) 500 MG tablet Take 1,000 mg by mouth every 6 (six) hours as needed.   Yes [provider]  albuterol (PROVENTIL HFA;VENTOLIN HFA) 108 (90 Base) MCG/ACT inhaler Inhale 2 puffs into the lungs every 6 (six) hours as needed for wheezing.   Yes [provider]  budesonide-formoterol (SYMBICORT) 160-4.5 MCG/ACT inhaler Inhale 2 puffs into the lungs every 12 (twelve) hours. 05/21/18  Yes Gladstone Lighter, MD  ipratropium-albuterol (DUONEB) 0.5-2.5 (3) MG/3ML SOLN Take 3  mLs by nebulization every 4 (four) hours as needed. 05/21/18  Yes Gladstone Lighter, MD  levofloxacin (LEVAQUIN) 500 MG tablet Take 500 mg by mouth daily. 02/06/19  Yes [provider]  predniSONE (DELTASONE) 10 MG tablet 6 PO Q D X 1 DAY, THEN 5 PO Q D X 1 DAY, THEN 4 PO Q D X 1 DAY, THEN 3 PO Q D X 1 DAY, THEN 2 PO Q D X 1 DAY, THEN 1 PO Q D X 1 DAY 05/13/18  Yes [provider]  tiotropium (SPIRIVA HANDIHALER) 18 MCG inhalation capsule Place 1 tablet into inhaler and inhale daily.   Yes [provider]   Allergies  Allergen Reactions  . Penicillins   . Sulfa Antibiotics Rash    REVIEW OF SYSTEMS:   Unable to obtain due to critical illness   VITAL SIGNS: Temp:  [96.8 F (36 C)-98.2 F (36.8 C)] 98.1 F (36.7 C) (10/15 1110) Pulse Rate:  [58-83] 66 (10/15 1300) Resp:  [16-20] 20 (10/15 1300) BP: (90-137)/(65-92) 106/79 (10/15 1300) SpO2:  [93 %-99 %] 96 % (10/15 1300) FiO2 (%):  [30 %] 30 % (10/15 1200) Weight:  [44.6 kg] 44.6 kg (10/15 0500)   I/O last 3 completed shifts: In: 1093.2 [I.V.:933.2; NG/GT:60; IV Piggyback:100] Out: 7353 [Urine:1180; Emesis/NG output:550] Total I/O In: 187 [I.V.:117; NG/GT:20; IV Piggyback:50] Out: 210 [Urine:160; Emesis/NG output:50]   SpO2: 96 % O2 Flow Rate (L/min): 15 L/min FiO2 (%): 30 %  Vent Mode: PRVC FiO2 (%):  [30 %] 30 % Set Rate:  [20 bmp] 20 bmp Vt Set:  [  400 mL-420 mL] 420 mL PEEP:  [5 cmH20] 5 cmH20   Physical Examination:  GENERAL:critically ill appearing, intubated and sedated-follows verbal commands when off sedation HEAD: Normocephalic, atraumatic.  EYES: Pupils equal, round, reactive to light.  No scleral icterus.  MOUTH: Moist mucosal membrane. NECK: Supple. No JVD.  PULMONARY: diminished breath sounds bilaterally CARDIOVASCULAR: S1 and S2. Regular rate and rhythm. No murmurs, rubs, or gallops.  GASTROINTESTINAL: Soft, nontender, -distended. No masses. Positive bowel sounds. No  hepatosplenomegaly.  MUSCULOSKELETAL: No swelling, clubbing, or edema.  NEUROLOGIC:  RASS -0 SKIN:intact,warm,dry  I personally reviewed lab work that was obtained in last 24 hrs. CXR Independently reviewed   MEDICATIONS: I have reviewed all medications and confirmed regimen as documented   CULTURE RESULTS   Recent Results (from the past 240 hour(s))  SARS Coronavirus 2 by RT PCR (hospital order, performed in [facility] hospital lab) Nasopharyngeal Nasopharyngeal Swab     Status: None   Collection Time: 02/24/2019 10:31 AM   Specimen: Nasopharyngeal Swab  Result Value Ref Range Status   SARS Coronavirus 2 NEGATIVE NEGATIVE Final    Comment: (NOTE) If result is NEGATIVE SARS-CoV-2 target nucleic acids are NOT DETECTED. The SARS-CoV-2 RNA is generally detectable in upper and lower  respiratory specimens during the acute phase of infection. The lowest  concentration of SARS-CoV-2 viral copies this assay can detect is 250  copies / mL. A negative result does not preclude SARS-CoV-2 infection  and should not be used as the sole basis for treatment or other  patient management decisions.  A negative result may occur with  improper specimen collection / handling, submission of specimen other  than nasopharyngeal swab, presence of viral mutation(s) within the  areas targeted by this assay, and inadequate number of viral copies  (<250 copies / mL). A negative result must be combined with clinical  observations, patient history, and epidemiological information. If result is POSITIVE SARS-CoV-2 target nucleic acids are DETECTED. The SARS-CoV-2 RNA is generally detectable in upper and lower  respiratory specimens dur ing the acute phase of infection.  Positive  results are indicative of active infection with SARS-CoV-2.  Clinical  correlation with patient history and other diagnostic information is  necessary to determine patient infection status.  Positive results do  not rule out  bacterial infection or co-infection with other viruses. If result is PRESUMPTIVE POSTIVE SARS-CoV-2 nucleic acids MAY BE PRESENT.   A presumptive positive result was obtained on the submitted specimen  and confirmed on repeat testing.  While 2019 novel coronavirus  (SARS-CoV-2) nucleic acids may be present in the submitted sample  additional confirmatory testing may be necessary for epidemiological  and / or clinical management purposes  to differentiate between  SARS-CoV-2 and other Sarbecovirus currently known to infect humans.  If clinically indicated additional testing with an alternate test  methodology [phone]) is advised. The SARS-CoV-2 RNA is generally  detectable in upper and lower respiratory sp ecimens during the acute  phase of infection. The expected result is Negative. Fact Sheet for Patients:  StrictlyIdeas.no Fact Sheet for Healthcare Providers: BankingDealers.co.za This test is not yet approved or cleared by the [place] FDA and has been authorized for detection and/or diagnosis of SARS-CoV-2 by FDA under an Emergency Use Authorization (EUA).  This EUA will remain in effect (meaning this test can be used) for the duration of the COVID-19 declaration under Section 564(b)(1) of the Act, 21 U.S.C. section 360bbb-3(b)(1), unless the authorization is terminated or revoked sooner. Performed  at [facility], [address] [place], [place] [address]   MRSA PCR Screening     Status: None   Collection Time: 02/13/19 10:31 AM   Specimen: Nasal Mucosa; Nasopharyngeal  Result Value Ref Range Status   MRSA by PCR NEGATIVE NEGATIVE Final    Comment:        The GeneXpert MRSA Assay (FDA approved for NASAL specimens only), is one component of a comprehensive MRSA colonization surveillance program. It is not intended to diagnose MRSA infection nor to guide or monitor treatment for MRSA infections. Performed at  [facility], [address], [place], [place] [address]           IMAGING    No results found.      Indwelling Urinary Catheter continued, requirement due to   Reason to continue Indwelling Urinary Catheter strict Intake/Output monitoring for hemodynamic instability         Ventilator continued, requirement due to severe respiratory failure   Ventilator Sedation RASS  -2      ASSESSMENT AND PLAN  68 yo female admitted to the ICU for acute hypoxic respiratory failure secondary to COPD exacerbation requiring emergent intubation.  Severe ACUTE Hypoxic and Hypercapnic Respiratory Failure -continue Full MV support -Advanced bullous emphysema with end-stage COPD -Discussed with daughter today recommend hospice care with chronic opioid treatment for severe dyspnea and hypoxemia with active smoking -repeat ABG reviewed - Ventilator adjusted to increase Vmin -continue Bronchodilator Therapy -Wean Fio2 and PEEP as tolerated -will perform SAT/SBT when respiratory parameters are met  ACUTE SEVERE COPD EXACERBATION -continue IV steroids as prescribed -continue NEB THERAPY as prescribed -COPD carepath -wean fio2 as needed and tolerated   NEUROLOGY - intubated and sedated - minimal sedation to achieve a RASS goal: -1 Wake up assessment pending  Leukocytosis  - Resolving, most likely due to gluccocorticoids  CARDIAC ICU monitoring  GI GI PROPHYLAXIS as indicated  NUTRITIONAL STATUS DIET-->TF's as tolerated Constipation protocol as indicated  ENDO - will use ICU hypoglycemic\Hyperglycemia protocol if needed  ELECTROLYTES -follow labs as needed -replace as needed -pharmacy consultation and following  DVT/GI PRX ordered TRANSFUSIONS AS NEEDED MONITOR FSBS ASSESS the need for LABS   Jacques Navy, PA-S  Ottie Glazier, M.D.  [facility] [place]   Critical care provider statement:   Critical  care time (minutes): 33  Critical care time was exclusive of: Separately billable procedures and  treating other patients  Critical care was necessary to treat or prevent imminent or  life-threatening deterioration of the following conditions:  Acute hyper And hypoxemic respiratory failure, acute severe COPD exacerbation, multiple comorbid conditions  Critical care was time spent personally by me on the following  activities: Development of treatment plan with patient or surrogate,  discussions with consultants, evaluation of patient's response to  treatment, examination of patient, obtaining history from patient or  surrogate, ordering and performing treatments and interventions, ordering  and review of laboratory studies and re-evaluation of patient's condition  I assumed direction of critical care for this patient from another  provider in my specialty: no

## 2019-02-15 NOTE — Progress Notes (Signed)
[employer] - [place] [facility]   PATIENT NAME: Audrey Ochoa    MR#:  [id number]  DATE OF BIRTH:  20-Feb-1951  SUBJECTIVE:  CHIEF COMPLAINT:   Chief Complaint  Patient presents with  . Respiratory Distress   Intubated on full vent support  Propofol  REVIEW OF SYSTEMS:    Review of Systems  Constitutional: Positive for malaise/fatigue. Negative for chills and fever.  HENT: Negative for sore throat.   Eyes: Negative for blurred vision, double vision and pain.  Respiratory: Positive for cough, shortness of breath and wheezing. Negative for hemoptysis.   Cardiovascular: Negative for chest pain, palpitations, orthopnea and leg swelling.  Gastrointestinal: Negative for abdominal pain, constipation, diarrhea, heartburn, nausea and vomiting.  Genitourinary: Negative for dysuria and hematuria.  Musculoskeletal: Negative for back pain and joint pain.  Skin: Negative for rash.  Neurological: Negative for sensory change, speech change, focal weakness and headaches.  Endo/Heme/Allergies: Does not bruise/bleed easily.  Psychiatric/Behavioral: Negative for depression. The patient is not nervous/anxious.     DRUG ALLERGIES:   Allergies  Allergen Reactions  . Penicillins   . Sulfa Antibiotics Rash    VITALS:  Blood pressure 131/80, pulse (!) 57, temperature 98 F (36.7 C), temperature source Axillary, resp. rate 20, height 5' 0.98" (1.549 m), weight 44.6 kg, SpO2 98 %.  PHYSICAL EXAMINATION:   Physical Exam  GENERAL:  68 y.o.-year-old patient lying in the bed with significant respiratory distress EYES: Pupils equal, round, reactive to light and accommodation. No scleral icterus. Extraocular muscles intact.  HEENT: Head atraumatic, normocephalic. Oropharynx and nasopharynx clear.  NECK:  Supple, no jugular venous distention. No thyroid enlargement, no tenderness.  LUNGS: No wheezing CARDIOVASCULAR: S1, S2 normal. No murmurs, rubs, or gallops.  ABDOMEN:  Soft, nontender, nondistended. Bowel sounds present. No organomegaly or mass.  EXTREMITIES: No cyanosis, clubbing or edema b/l.    NEUROLOGIC: Sedated PSYCHIATRIC: The patient is sedated SKIN: No obvious rash, lesion, or ulcer.   LABORATORY PANEL:   CBC Recent Labs  Lab 02/15/19 0531  WBC 11.5*  HGB 13.9  HCT 41.9  PLT 405*   ------------------------------------------------------------------------------------------------------------------ Chemistries  Recent Labs  Lab 02/15/19 0531  NA 140  K 3.9  CL 97*  CO2 32  GLUCOSE 115*  BUN 35*  CREATININE 0.67  CALCIUM 8.9   ------------------------------------------------------------------------------------------------------------------  Cardiac Enzymes No results for input(s): TROPONINI in the last 168 hours. ------------------------------------------------------------------------------------------------------------------  RADIOLOGY:  Dg Chest Port 1 View  Result Date: 02/14/2019 CLINICAL DATA:  Acute respiratory failure EXAM: PORTABLE CHEST 1 VIEW COMPARISON:  Radiograph 02/13/2019 FINDINGS: Endotracheal tube in the mid trachea, 3.3 cm from the carina. Transesophageal tube tip and side port are distal to the GE junction. Hyperinflation of the lungs with chronic interstitial opacities and chronic bronchitic changes. No new consolidative process or features of edema. No pneumothorax or effusion. No acute osseous or soft tissue abnormality. Degenerative changes are present in the imaged spine and shoulders. IMPRESSION: Satisfactory positioning of the endotracheal and transesophageal tubes. No significant interval change from 1 day prior. Chronic bronchitic changes and features of emphysema. Electronically Signed   By: Kreg Shropshire M.D.   On: 02/14/2019 04:48     ASSESSMENT AND PLAN:   *Acute COPD exacerbation with acute on chronic hypoxic respiratory failure On IV steroids, scheduled nebulizers and inhalers. Continue full vent  support.  *Leukocytosis likely due to prednisone use at home.  No infiltrate on chest x-ray.  Afebrile.  Improving.  *Elevated blood pressure without  diagnosis of hypertension.  stabilized  DVT prophylaxis with Lovenox  All the records are reviewed and case discussed with Care Management/Social Worker Management plans discussed with the patient, family and they are in agreement.  CODE STATUS: Full code  TOTALTIME TAKING CARE OF THIS PATIENT: 35 minutes.   Leia Alf Malorie Bigford M.D on 02/15/2019 at 5:06 PM  Between 7am to 6pm - Pager - [phone]  After 6pm go to [email or web address] - password [email or web address] [facility]  Office  [phone]  CC: Primary care physician; Theotis Burrow, MD  Note: This dictation was prepared with Dragon dictation along with smaller phrase technology. Any transcriptional errors that result from this process are unintentional.

## 2019-02-15 NOTE — Progress Notes (Addendum)
Ch visited pt that was transferred to ICU since last encounter with pt. Pt currently was experiencing resp distress and is now intubated w/ mittens  but was somewhat responsive while ch at bedside. Ch checked in w/ dau who is at bedside. Pt dau shared that she lives nearby and supports both parents as needed. Dau confirmed that pt has been taking care of the husband that has limited physical/mental abilities. Dau expressed that it has been a challenge for the pt to focus on her own health. Ch provided words of comfort and took her number as the best point of contact for pt.    02/15/19 1000  Clinical Encounter Type  Visited With Patient and family together  Visit Type Social support;Critical Care  Spiritual Encounters  Spiritual Needs Emotional;Grief support  Stress Factors  Patient Stress Factors Health changes  Family Stress Factors Major life changes

## 2019-02-15 NOTE — Plan of Care (Signed)
Palliative:  We talk about acute and chronic illness.  Conference with nursing staff.  PMT to have family meeting 10/16 at 11 am.  11 minutes Quinn Axe, NP

## 2019-02-15 NOTE — Plan of Care (Signed)
  Problem: Education: Goal: Knowledge of disease or condition will improve Outcome: Progressing   Problem: Activity: Goal: Ability to tolerate increased activity will improve Outcome: Progressing   Problem: Respiratory: Goal: Ability to maintain a clear airway will improve Outcome: Progressing Goal: Levels of oxygenation will improve Outcome: Progressing Goal: Ability to maintain adequate ventilation will improve Outcome: Progressing   Problem: Education: Goal: Knowledge of General Education information will improve Description: Including pain rating scale, medication(s)/side effects and non-pharmacologic comfort measures Outcome: Progressing   Problem: Coping: Goal: Level of anxiety will decrease Outcome: Progressing   Problem: Safety: Goal: Ability to remain free from injury will improve Outcome: Progressing

## 2019-02-16 ENCOUNTER — Encounter: Payer: Self-pay | Admitting: Primary Care

## 2019-02-16 DIAGNOSIS — J9601 Acute respiratory failure with hypoxia: Secondary | ICD-10-CM | POA: Diagnosis not present

## 2019-02-16 DIAGNOSIS — Z7189 Other specified counseling: Secondary | ICD-10-CM | POA: Diagnosis not present

## 2019-02-16 DIAGNOSIS — J441 Chronic obstructive pulmonary disease with (acute) exacerbation: Secondary | ICD-10-CM | POA: Diagnosis not present

## 2019-02-16 DIAGNOSIS — Z515 Encounter for palliative care: Secondary | ICD-10-CM | POA: Diagnosis not present

## 2019-02-16 LAB — CBC
HCT: 42.8 % (ref 36.0–46.0)
Hemoglobin: 14.1 g/dL (ref 12.0–15.0)
MCH: 33.6 pg (ref 26.0–34.0)
MCHC: 32.9 g/dL (ref 30.0–36.0)
MCV: 101.9 fL — ABNORMAL HIGH (ref 80.0–100.0)
Platelets: 410 10*3/uL — ABNORMAL HIGH (ref 150–400)
RBC: 4.2 MIL/uL (ref 3.87–5.11)
RDW: 13.4 % (ref 11.5–15.5)
WBC: 12.2 10*3/uL — ABNORMAL HIGH (ref 4.0–10.5)
nRBC: 0 % (ref 0.0–0.2)

## 2019-02-16 LAB — BASIC METABOLIC PANEL
Anion gap: 12 (ref 5–15)
BUN: 43 mg/dL — ABNORMAL HIGH (ref 8–23)
CO2: 34 mmol/L — ABNORMAL HIGH (ref 22–32)
Calcium: 9 mg/dL (ref 8.9–10.3)
Chloride: 97 mmol/L — ABNORMAL LOW (ref 98–111)
Creatinine, Ser: 0.72 mg/dL (ref 0.44–1.00)
GFR calc Af Amer: >60 mL/min (ref >60)
GFR calc non Af Amer: >60 mL/min (ref >60)
Glucose, Bld: 133 mg/dL — ABNORMAL HIGH (ref 70–99)
Potassium: 3.9 mmol/L (ref 3.5–5.1)
Sodium: 143 mmol/L (ref 135–145)

## 2019-02-16 LAB — GLUCOSE, CAPILLARY: Glucose-Capillary: 123 mg/dL — ABNORMAL HIGH (ref 70–99)

## 2019-02-16 MED ORDER — IPRATROPIUM-ALBUTEROL 0.5-2.5 (3) MG/3ML IN SOLN: 3.0000 mL | RESPIRATORY_TRACT | Status: DC | PRN

## 2019-02-16 MED ORDER — SENNOSIDES 8.8 MG/5ML PO SYRP
10.0000 mL | ORAL_SOLUTION | Freq: Every day | ORAL | Status: DC
  Filled 2019-02-16: qty 10

## 2019-02-16 MED ORDER — POLYVINYL ALCOHOL 1.4 % OP SOLN
1.0000 [drp] | OPHTHALMIC | Status: DC | PRN
  Filled 2019-02-16: qty 15

## 2019-02-16 MED ORDER — MORPHINE SULFATE (PF) 2 MG/ML IV SOLN
2.0000 mg | INTRAVENOUS | Status: DC | PRN
  Filled 2019-02-16: qty 1

## 2019-02-16 MED ORDER — MORPHINE 100MG IN NS 100ML (1MG/ML) PREMIX INFUSION
2.0000 mg/h | INTRAVENOUS | Status: DC
  Administered 2019-02-16 (×2): 2 mg/h via INTRAVENOUS
  Filled 2019-02-16: qty 100

## 2019-02-16 MED ORDER — METHYLPREDNISOLONE SODIUM SUCC 40 MG IJ SOLR: 40.0000 mg | Freq: Every day | INTRAMUSCULAR | Status: DC

## 2019-02-16 MED ORDER — LORAZEPAM 2 MG/ML IJ SOLN
1.0000 mg | INTRAMUSCULAR | Status: DC
  Administered 2019-02-16 (×2): 2 mg via INTRAVENOUS
  Filled 2019-02-16 (×3): qty 1

## 2019-02-16 NOTE — Progress Notes (Signed)
Name: Audrey Ochoa MRN: [id number] DOB: March 11, 1951     CONSULTATION DATE: 02/14/2019  CHIEF COMPLAINT:  Acute hypoxic respiratory distress  SIGNIFICANT EVENTS/STUDIES:  10/12 Admitted to hospital with diagnosis of acute hypoxic respiratory failure secondary to COPD exacerbation initially on BiPAP, but able to wean to 4L O2 10/13 Pt intubated due to increased WOB with severe hypoxemia failure of BIPAP 10/14 - Failed weaning trial , immediate resp distress 10/15-family conference with daughter, recommend hospice care due to end-stage COPD with severe bullous emphysema and active smoking. 10/16- PALS team met with family , pt is DNR, will likely perform terminal weaning as per family and make hospice.   HISTORY OF PRESENT ILLNESS:   Audrey Ochoa is a 68 yo female with a past medical history of COPD emphysema with as needed oxygen at home presented to the emergency room complaining of 5 days of shortness of breath, cough and wheezing acutely worsening. Pt received multiple nebulizer treatments by EMS that did not help and was put on BiPAP in the ED. She was weaned to 4L O2 and admitted to the floor. She was covid negative. 10/13, pt became progressively short of breath and had to be transferred to the ICU for treatment. In ICU, pt was intubated for increased WOB 10/13.  PAST MEDICAL HISTORY :   has a past medical history of Emphysema of lung ([place]).  has a past surgical history that includes none. Prior to Admission medications   Medication Sig Start Date End Date Taking? Authorizing Provider  acetaminophen (TYLENOL) 500 MG tablet Take 1,000 mg by mouth every 6 (six) hours as needed.   Yes [provider]  albuterol (PROVENTIL HFA;VENTOLIN HFA) 108 (90 Base) MCG/ACT inhaler Inhale 2 puffs into the lungs every 6 (six) hours as needed for wheezing.   Yes [provider]  budesonide-formoterol (SYMBICORT) 160-4.5 MCG/ACT inhaler Inhale 2 puffs into the lungs every 12  (twelve) hours. 05/21/18  Yes Gladstone Lighter, MD  ipratropium-albuterol (DUONEB) 0.5-2.5 (3) MG/3ML SOLN Take 3 mLs by nebulization every 4 (four) hours as needed. 05/21/18  Yes Gladstone Lighter, MD  levofloxacin (LEVAQUIN) 500 MG tablet Take 500 mg by mouth daily. 02/06/19  Yes [provider]  predniSONE (DELTASONE) 10 MG tablet 6 PO Q D X 1 DAY, THEN 5 PO Q D X 1 DAY, THEN 4 PO Q D X 1 DAY, THEN 3 PO Q D X 1 DAY, THEN 2 PO Q D X 1 DAY, THEN 1 PO Q D X 1 DAY 05/13/18  Yes [provider]  tiotropium (SPIRIVA HANDIHALER) 18 MCG inhalation capsule Place 1 tablet into inhaler and inhale daily.   Yes [provider]   Allergies  Allergen Reactions  . Penicillins   . Sulfa Antibiotics Rash    REVIEW OF SYSTEMS:   Unable to obtain due to critical illness   VITAL SIGNS: Temp:  [97.9 F (36.6 C)-98.7 F (37.1 C)] 98.6 F (37 C) (10/16 0730) Pulse Rate:  [54-68] 61 (10/16 1000) Resp:  [20] 20 (10/16 1000) BP: (101-148)/(73-90) 131/89 (10/16 1000) SpO2:  [94 %-98 %] 95 % (10/16 1000) FiO2 (%):  [30 %] 30 % (10/16 0752) Weight:  [43.7 kg] 43.7 kg (10/16 0500)   I/O last 3 completed shifts: In: 956.8 [I.V.:816.8; NG/GT:40; IV Piggyback:100] Out: 8242 [Urine:1175; Emesis/NG output:500] Total I/O In: 22.1 [I.V.:22.1] Out: 0    SpO2: 95 % O2 Flow Rate (L/min): 15 L/min FiO2 (%): 30 %  Vent Mode: PRVC FiO2 (%):  [  30 %] 30 % Set Rate:  [20 bmp] 20 bmp Vt Set:  [420 mL] 420 mL PEEP:  [5 cmH20] 5 cmH20 Plateau Pressure:  [17 cmH20] 17 cmH20   Physical Examination:  GENERAL:critically ill appearing, intubated and sedated-follows verbal commands when off sedation but in severe distress HEAD: Normocephalic, atraumatic.  EYES: Pupils equal, round, reactive to light.  No scleral icterus.  MOUTH: Moist mucosal membrane. NECK: Supple. No JVD.  PULMONARY: diminished breath sounds bilaterally CARDIOVASCULAR: S1 and S2. Regular rate and rhythm. No murmurs,  rubs, or gallops.  GASTROINTESTINAL: Soft, nontender, -distended. No masses. Positive bowel sounds. No hepatosplenomegaly.  MUSCULOSKELETAL: No swelling, clubbing, or edema.  NEUROLOGIC:  RASS -0 SKIN:intact,warm,dry  I personally reviewed lab work that was obtained in last 24 hrs. CXR Independently reviewed   MEDICATIONS: I have reviewed all medications and confirmed regimen as documented   CULTURE RESULTS   Recent Results (from the past 240 hour(s))  SARS Coronavirus 2 by RT PCR (hospital order, performed in [facility] hospital lab) Nasopharyngeal Nasopharyngeal Swab     Status: None   Collection Time: 03/01/2019 10:31 AM   Specimen: Nasopharyngeal Swab  Result Value Ref Range Status   SARS Coronavirus 2 NEGATIVE NEGATIVE Final    Comment: (NOTE) If result is NEGATIVE SARS-CoV-2 target nucleic acids are NOT DETECTED. The SARS-CoV-2 RNA is generally detectable in upper and lower  respiratory specimens during the acute phase of infection. The lowest  concentration of SARS-CoV-2 viral copies this assay can detect is 250  copies / mL. A negative result does not preclude SARS-CoV-2 infection  and should not be used as the sole basis for treatment or other  patient management decisions.  A negative result may occur with  improper specimen collection / handling, submission of specimen other  than nasopharyngeal swab, presence of viral mutation(s) within the  areas targeted by this assay, and inadequate number of viral copies  (<250 copies / mL). A negative result must be combined with clinical  observations, patient history, and epidemiological information. If result is POSITIVE SARS-CoV-2 target nucleic acids are DETECTED. The SARS-CoV-2 RNA is generally detectable in upper and lower  respiratory specimens dur ing the acute phase of infection.  Positive  results are indicative of active infection with SARS-CoV-2.  Clinical  correlation with patient history and other diagnostic  information is  necessary to determine patient infection status.  Positive results do  not rule out bacterial infection or co-infection with other viruses. If result is PRESUMPTIVE POSTIVE SARS-CoV-2 nucleic acids MAY BE PRESENT.   A presumptive positive result was obtained on the submitted specimen  and confirmed on repeat testing.  While 2019 novel coronavirus  (SARS-CoV-2) nucleic acids may be present in the submitted sample  additional confirmatory testing may be necessary for epidemiological  and / or clinical management purposes  to differentiate between  SARS-CoV-2 and other Sarbecovirus currently known to infect humans.  If clinically indicated additional testing with an alternate test  methodology [phone]) is advised. The SARS-CoV-2 RNA is generally  detectable in upper and lower respiratory sp ecimens during the acute  phase of infection. The expected result is Negative. Fact Sheet for Patients:  StrictlyIdeas.no Fact Sheet for Healthcare Providers: BankingDealers.co.za This test is not yet approved or cleared by the [place] FDA and has been authorized for detection and/or diagnosis of SARS-CoV-2 by FDA under an Emergency Use Authorization (EUA).  This EUA will remain in effect (meaning this test can be used) for the  duration of the COVID-19 declaration under Section 564(b)(1) of the Act, 21 U.S.C. section 360bbb-3(b)(1), unless the authorization is terminated or revoked sooner. Performed at [facility], [address] [place], [place] [address]   MRSA PCR Screening     Status: None   Collection Time: 02/13/19 10:31 AM   Specimen: Nasal Mucosa; Nasopharyngeal  Result Value Ref Range Status   MRSA by PCR NEGATIVE NEGATIVE Final    Comment:        The GeneXpert MRSA Assay (FDA approved for NASAL specimens only), is one component of a comprehensive MRSA colonization surveillance program. It is  not intended to diagnose MRSA infection nor to guide or monitor treatment for MRSA infections. Performed at [facility], [address] [place], [place] [address]           IMAGING    No results found.      Indwelling Urinary Catheter continued, requirement due to   Reason to continue Indwelling Urinary Catheter strict Intake/Output monitoring for hemodynamic instability         Ventilator continued, requirement due to severe respiratory failure   Ventilator Sedation RASS  -2      ASSESSMENT AND PLAN  68 yo female admitted to the ICU for acute hypoxic respiratory failure secondary to COPD exacerbation requiring emergent intubation.  Severe ACUTE Hypoxic and Hypercapnic Respiratory Failure -continue Full MV support -Advanced bullous emphysema with end-stage COPD -Discussed with daughter today recommend hospice care with chronic opioid treatment for severe dyspnea and hypoxemia with active smoking -repeat ABG reviewed - Ventilator adjusted to increase Vmin -continue Bronchodilator Therapy -Wean Fio2 and PEEP as tolerated -will perform SAT/SBT when respiratory parameters are met   ACUTE SEVERE COPD EXACERBATION -continue IV steroids as prescribed -continue NEB THERAPY as prescribed -COPD carepath -wean fio2 as needed and tolerated   NEUROLOGY - intubated and sedated - minimal sedation to achieve a RASS goal: -1 Wake up assessment pending  Leukocytosis  - Resolving, most likely due to gluccocorticoids  CARDIAC ICU monitoring  GI GI PROPHYLAXIS as indicated  NUTRITIONAL STATUS DIET-->TF's as tolerated Constipation protocol as indicated  ENDO - will use ICU hypoglycemic\Hyperglycemia protocol if needed  ELECTROLYTES -follow labs as needed -replace as needed -pharmacy consultation and following  DVT/GI PRX ordered TRANSFUSIONS AS NEEDED MONITOR FSBS ASSESS the need for LABS   Jacques Navy, PA-S  Ottie Glazier,  M.D.  Pulmonary & [place]   Critical care provider statement:   Critical care time (minutes): 33  Critical care time was exclusive of: Separately billable procedures and  treating other patients  Critical care was necessary to treat or prevent imminent or  life-threatening deterioration of the following conditions:  Acute hyper And hypoxemic respiratory failure, acute severe COPD exacerbation, multiple comorbid conditions  Critical care was time spent personally by me on the following  activities: Development of treatment plan with patient or surrogate,  discussions with consultants, evaluation of patient's response to  treatment, examination of patient, obtaining history from patient or  surrogate, ordering and performing treatments and interventions, ordering  and review of laboratory studies and re-evaluation of patient's condition  I assumed direction of critical care for this patient from another  provider in my specialty: no

## 2019-02-16 NOTE — Progress Notes (Signed)
[place] [facility] [place] NAME: Audrey Ochoa    MR#:  [id number]  DATE OF BIRTH:  June 22, 1950  SUBJECTIVE:  CHIEF COMPLAINT:   Chief Complaint  Patient presents with  . Respiratory Distress   Intubated on full vent support  Propofol  REVIEW OF SYSTEMS:    Review of Systems  Constitutional: Positive for malaise/fatigue. Negative for chills and fever.  HENT: Negative for sore throat.   Eyes: Negative for blurred vision, double vision and pain.  Respiratory: Positive for cough, shortness of breath and wheezing. Negative for hemoptysis.   Cardiovascular: Negative for chest pain, palpitations, orthopnea and leg swelling.  Gastrointestinal: Negative for abdominal pain, constipation, diarrhea, heartburn, nausea and vomiting.  Genitourinary: Negative for dysuria and hematuria.  Musculoskeletal: Negative for back pain and joint pain.  Skin: Negative for rash.  Neurological: Negative for sensory change, speech change, focal weakness and headaches.  Endo/Heme/Allergies: Does not bruise/bleed easily.  Psychiatric/Behavioral: Negative for depression. The patient is not nervous/anxious.    DRUG ALLERGIES:   Allergies  Allergen Reactions  . Penicillins   . Sulfa Antibiotics Rash    VITALS:  Blood pressure (!) 141/88, pulse 66, temperature 98.6 F (37 C), temperature source Oral, resp. rate 20, height 5' 0.98" (1.549 m), weight 43.7 kg, SpO2 92 %.  PHYSICAL EXAMINATION:   Physical Exam  GENERAL:  68 y.o.-year-old patient lying in the bed with significant respiratory distress EYES: Pupils equal, round, reactive to light and accommodation. No scleral icterus. Extraocular muscles intact.  HEENT: Head atraumatic, normocephalic. Oropharynx and nasopharynx clear.  NECK:  Supple, no jugular venous distention. No thyroid enlargement, no tenderness.  LUNGS: No wheezing CARDIOVASCULAR: S1, S2 normal. No murmurs, rubs, or gallops.  ABDOMEN: Soft,  nontender, nondistended. Bowel sounds present. No organomegaly or mass.  EXTREMITIES: No cyanosis, clubbing or edema b/l.    NEUROLOGIC: Sedated PSYCHIATRIC: The patient is sedated SKIN: No obvious rash, lesion, or ulcer.   LABORATORY PANEL:   CBC Recent Labs  Lab 02/16/19 0721  WBC 12.2*  HGB 14.1  HCT 42.8  PLT 410*   ------------------------------------------------------------------------------------------------------------------ Chemistries  Recent Labs  Lab 02/16/19 0721  NA 143  K 3.9  CL 97*  CO2 34*  GLUCOSE 133*  BUN 43*  CREATININE 0.72  CALCIUM 9.0   ------------------------------------------------------------------------------------------------------------------  Cardiac Enzymes No results for input(s): TROPONINI in the last 168 hours. ------------------------------------------------------------------------------------------------------------------  RADIOLOGY:  No results found.   ASSESSMENT AND PLAN:   *Acute COPD exacerbation with acute on chronic hypoxic respiratory failure On IV steroids, scheduled nebulizers and inhalers. Continue full vent support. Appreciate pulmonary input  *Leukocytosis l due to prednisone at home has improved  *Elevated blood pressure without diagnosis of hypertension.  stabilized  DVT prophylaxis with Lovenox  All the records are reviewed and case discussed with Care Management/Social Worker Management plans discussed with the patient, family and they are in agreement.  CODE STATUS: Full code  TOTALTIME TAKING CARE OF THIS PATIENT: 35 minutes.   Leia Alf Buena Boehm M.D on 02/16/2019 at 1:24 PM  Between 7am to 6pm - Pager - [phone]  After 6pm go to [email or web address] - password [email or web address] [facility]  Office  [phone]  CC: Primary care physician; Theotis Burrow, MD  Note: This dictation was prepared with Dragon dictation along with smaller phrase technology. Any transcriptional  errors that result from this process are unintentional.

## 2019-02-16 NOTE — Progress Notes (Signed)
Pt extubated with no complications, no stridor noted. Will continue to monitor.

## 2019-02-16 NOTE — Consult Note (Signed)
Consultation Note Date: 02/16/2019   Patient Name: Audrey Ochoa  DOB: [birth year]  MRN: [id number]  Age / Sex: 68 y.o., female  PCP: Revelo, Elyse Jarvis, MD Referring Physician: Hillary Bow, MD  Reason for Consultation: Establishing goals of care  HPI/Patient Profile: 68 y.o. female  with past medical COPD, current smoker. history of COPD emphysema, frailty, admitted on 02/25/2019 with acute COPD exacerbation.   Clinical Assessment and Goals of Care: [name], Ochoa is lying in bed, intubated/ventilated/sedated.  She has severe lung disease and family shares that she would not want long term intubation, trach/PEG.   We talk in detail about Audrey Ochoa's life.  She was born in [place], [place]  She moved to [facility] in [place], where she met and married Audrey Ochoa.  She has siblings in [place], [place] and [place].  They are close.   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   We talk about code status, compassionate extubation .  Family is given time to consider how to care for [name]  They tell me that she would not want trach/PEG. Code status changed to DNR.  We talk about what compassionate extubation would look like/feel like for [name] I reassure family that she will be kept comfortable. We talk about possibility of sudden passing or possible that Audrey Ochoa could last for a few days.   Return later in the day for family conference.  Family elects compassionate extubation at this time. RN and RT notified. Comfort measures in place.  Anticipate hospital death.   HCPOA    NEXT OF KIN - husband of 50 years Audrey Ochoa and only child, daughter Audrey Ochoa makes choices as a team.    SUMMARY OF RECOMMENDATIONS   Compassionate extubation, do not reintubate   Code Status/Advance Care Planning:  DNR  Symptom Management:   Morphine and ativan for  comfort  Palliative Prophylaxis:   Frequent Pain Assessment, Oral Care and Turn Reposition  Additional Recommendations (Limitations, Scope, Preferences):  Full Comfort Care  Psycho-social/Spiritual:   Desire for further Chaplaincy support:no  Additional Recommendations: Caregiving  Support/Resources and Grief/Bereavement Support  Prognosis:   Hours - Days  Discharge Planning: Anticipated Hospital Death      Primary Diagnoses: Present on Admission: . COPD exacerbation ([place])   I have reviewed the medical record, interviewed the patient and family, and examined the patient. The following aspects are pertinent.  Past Medical History:  Diagnosis Date  . Emphysema of lung ([place])    Social History   Socioeconomic History  . Marital status: Married    Spouse name: Not on file  . Number of children: Not on file  . Years of education: Not on file  . Highest education level: Not on file  Occupational History  . Not on file  Social Needs  . Financial resource strain: Not on file  . Food insecurity    Worry: Not on file    Inability: Not on file  .  Transportation needs    Medical: Not on file    Non-medical: Not on file  Tobacco Use  . Smoking status: Former Smoker    Packs/day: 0.75    Years: 48.00    Pack years: 36.00    Types: Cigarettes    Quit date: 05/25/2018    Years since quitting: 0.7  . Smokeless tobacco: Never Used  Substance and Sexual Activity  . Alcohol use: Yes  . Drug use: Not on file  . Sexual activity: Not on file  Lifestyle  . Physical activity    Days per week: Not on file    Minutes per session: Not on file  . Stress: Not on file  Relationships  . Social Herbalist on phone: Not on file    Gets together: Not on file    Attends religious service: Not on file    Active member of club or organization: Not on file    Attends meetings of clubs or organizations: Not on file    Relationship status: Not on file  Other Topics  Concern  . Not on file  Social History Narrative  . Not on file   History reviewed. No pertinent family history. Scheduled Meds: . chlorhexidine gluconate (MEDLINE KIT)  15 mL Mouth Rinse BID  . Chlorhexidine Gluconate Cloth  6 each Topical Daily  . ipratropium-albuterol  3 mL Nebulization TID  . mouth rinse  15 mL Mouth Rinse 10 times per day  . methylPREDNISolone (SOLU-MEDROL) injection  60 mg Intravenous Q12H  . mometasone-formoterol  2 puff Inhalation BID  . pantoprazole (PROTONIX) IV  40 mg Intravenous Q12H   Continuous Infusions: . sodium chloride 5 mL/hr at 02/16/19 0600  . phenylephrine (NEO-SYNEPHRINE) Adult infusion Stopped (02/14/19 8416)  . propofol (DIPRIVAN) infusion 50 mcg/kg/min (02/16/19 0730)   PRN Meds:.sodium chloride, acetaminophen **OR** acetaminophen, albuterol, fentaNYL (SUBLIMAZE) injection, fentaNYL (SUBLIMAZE) injection, labetalol, LORazepam, midazolam, ondansetron **OR** ondansetron (ZOFRAN) IV, polyethylene glycol Medications Prior to Admission:  Prior to Admission medications   Medication Sig Start Date End Date Taking? Authorizing Provider  acetaminophen (TYLENOL) 500 MG tablet Take 1,000 mg by mouth every 6 (six) hours as needed.   Yes [provider]  albuterol (PROVENTIL HFA;VENTOLIN HFA) 108 (90 Base) MCG/ACT inhaler Inhale 2 puffs into the lungs every 6 (six) hours as needed for wheezing.   Yes [provider]  budesonide-formoterol (SYMBICORT) 160-4.5 MCG/ACT inhaler Inhale 2 puffs into the lungs every 12 (twelve) hours. 05/21/18  Yes Gladstone Lighter, MD  ipratropium-albuterol (DUONEB) 0.5-2.5 (3) MG/3ML SOLN Take 3 mLs by nebulization every 4 (four) hours as needed. 05/21/18  Yes Gladstone Lighter, MD  levofloxacin (LEVAQUIN) 500 MG tablet Take 500 mg by mouth daily. 02/06/19  Yes [provider]  predniSONE (DELTASONE) 10 MG tablet 6 PO Q D X 1 DAY, THEN 5 PO Q D X 1 DAY, THEN 4 PO Q D X 1 DAY, THEN 3 PO Q D X 1 DAY, THEN  2 PO Q D X 1 DAY, THEN 1 PO Q D X 1 DAY 05/13/18  Yes [provider]  tiotropium (SPIRIVA HANDIHALER) 18 MCG inhalation capsule Place 1 tablet into inhaler and inhale daily.   Yes [provider]   Allergies  Allergen Reactions  . Penicillins   . Sulfa Antibiotics Rash   Review of Systems  Unable to perform ROS: Intubated    Physical Exam Vitals signs and nursing note reviewed.  Constitutional:  Appearance: She is ill-appearing.  Cardiovascular:     Rate and Rhythm: Normal rate.     Vital Signs: BP 131/89   Pulse 61   Temp 98.6 F (37 C) (Oral)   Resp 20   Ht 5' 0.98" (1.549 m)   Wt 43.7 kg   SpO2 95%   BMI 18.21 kg/m  Pain Scale: CPOT   Pain Score: 0-No pain   SpO2: SpO2: 95 % O2 Device:SpO2: 95 % O2 Flow Rate: .O2 Flow Rate (L/min): 15 L/min  IO: Intake/output summary:   Intake/Output Summary (Last 24 hours) at 02/16/2019 1018 Last data filed at 02/16/2019 0730 Gross per 24 hour  Intake 435.64 ml  Output 960 ml  Net -524.36 ml    LBM: Last BM Date: 02/11/19 Baseline Weight: Weight: 44.9 kg Most recent weight: Weight: 43.7 kg     Palliative Assessment/Data:   Flowsheet Rows     Most Recent Value  Intake Tab  Referral Department  Hospitalist  Unit at Time of Referral  ICU  Palliative Care Primary Diagnosis  Pulmonary  Date Notified  02/15/19  Palliative Care Type  New Palliative care  Reason for referral  Clarify Goals of Care, Psychosocial or Spiritual support  Date of Admission  02/02/2019  Date first seen by Palliative Care  02/15/19  # of days Palliative referral response time  0 Day(s)  # of days IP prior to Palliative referral  3  Clinical Assessment  Palliative Performance Scale Score  20%  Pain Max last 24 hours  Not able to report  Pain Min Last 24 hours  Not able to report  Dyspnea Max Last 24 Hours  Not able to report  Dyspnea Min Last 24 hours  Not able to report  Psychosocial & Spiritual Assessment   Palliative Care Outcomes      Time In:      1015       1440   Time Out:   1120       1520 Time Total:     65    +     40 =   105 minutes extended time  Greater than 50%  of this time was spent counseling and coordinating care related to the above assessment and plan.  Signed by: Audrey Novel, NP   Please contact Palliative Medicine Team phone at [phone] for questions and concerns.  For individual provider: See Shea Evans

## 2019-02-17 MED ORDER — LORAZEPAM 2 MG/ML IJ SOLN: 1.0000 mg | INTRAMUSCULAR | Status: DC | PRN

## 2019-03-04 NOTE — Progress Notes (Signed)
Audrey Ochoa of [place] [facility] called to find out more information about when pt skin cancer resolve. Could not find the information for caller.

## 2019-03-04 NOTE — Progress Notes (Signed)
Comfort care measures with family at bedside verbalizing acceptance, peace and feelings with emotional support given. Morphine drip continued at 2mg /hr with no sign/symptom discomfort. Color becoming more dusky with respirations more shallow/irregular. Family updated on changes. "Gone From My Sight" given/discussed with questions answered.

## 2019-03-04 NOTE — Progress Notes (Deleted)
delete

## 2019-03-04 NOTE — Progress Notes (Signed)
Dgt and other family member dgt with soft tearing and verbalizing peace and acceptance and grief appropriately. Husband not planning on coming back. Bereavement support given. Family reports they have all her belongings; pt does not have dentures. Family leaving now. Next shift nurse, Maudie Mercury will complete post mortem care and eye prep before transport to morgue.

## 2019-03-04 NOTE — Progress Notes (Signed)
Patient was found to be expired at change of shift. Time of death was 44. Patient's daughter was called, she had just left within the previous 30 min. Dr Bridgett Larsson notified. Organ donor services notified. 3 IV's and foley were removed. Post mortem care performed.

## 2019-03-04 NOTE — Progress Notes (Signed)
[facility] [place] NAME: Audrey Ochoa    MR#:  [id number]  DATE OF BIRTH:  [birth year]  SUBJECTIVE:  Pt transferred from ICU --now under comfort care On IV morphine gtt No family in the room in the morning  REVIEW OF SYSTEMS:   Review of Systems  Unable to perform ROS: Critical illness   Tolerating Diet: Tolerating PT:   DRUG ALLERGIES:   Allergies  Allergen Reactions  . Penicillins   . Sulfa Antibiotics Rash    VITALS:  Blood pressure 95/74, pulse 90, temperature 98.2 F (36.8 C), temperature source Oral, resp. rate 14, height 5' 0.98" (1.549 m), weight 43.7 kg, SpO2 (!) 81 %.  PHYSICAL EXAMINATION:   Physical Exam limted exam--comfort care GENERAL:  68 y.o.-year-old patient lying in the bed with no acute distress.  LUNGS: Normal breath sounds bilaterally, no wheezing, rales, rhonchi. No use of accessory muscles of respiration.  CARDIOVASCULAR: S1, S2 normal. No murmurs, rubs, or gallops.  ABDOMEN: Soft, nontender, nondistended. Bowel sounds present. No organomegaly or mass.    PSYCHIATRIC:  patient is lethargic SKIN: No obvious rash, lesion, or ulcer.   LABORATORY PANEL:  CBC Recent Labs  Lab 02/16/19 0721  WBC 12.2*  HGB 14.1  HCT 42.8  PLT 410*    Chemistries  Recent Labs  Lab 02/16/19 0721  NA 143  K 3.9  CL 97*  CO2 34*  GLUCOSE 133*  BUN 43*  CREATININE 0.72  CALCIUM 9.0   Cardiac Enzymes No results for input(s): TROPONINI in the last 168 hours. RADIOLOGY:  No results found. ASSESSMENT AND PLAN:   *Acute COPD exacerbation with acute on chronic hypoxic respiratory failure with terminal extubation now under comfort care Palliative care appreciated Prn ativan Morphine gtt  *Leukocytosis   *Elevated blood pressure without diagnosis of hypertension.   code DNR    CODE STATUS: dnr  DVT Prophylaxis: comfort care  TOTAL TIME TAKING CARE OF THIS PATIENT: **15* minutes.  >50%  time spent on counselling and coordination of care  Note: This dictation was prepared with Dragon dictation along with smaller phrase technology. Any transcriptional errors that result from this process are unintentional.  Fritzi Mandes M.D on 14-Mar-2019 at 2:10 PM  Between 7am to 6pm - Pager - [phone]  After 6pm go to [email or web address] - password [email or web address] [facility]  Office  [phone]  CC: Primary care physician; Audrey Ochoa, MDPatient ID: Audrey Ochoa, female   DOB: 12-Jan-1951, 68 y.o.   MRN: [id number]

## 2019-03-04 NOTE — Progress Notes (Signed)
81- RN's entered room for report with pt having no BP, P, R with expiration pronouncement done by Sula Rumple RN and myself. TC to Mickel Baas, family member and advised pt has expired. Stated they just left and will return. Morphine drip discontinue. On call provider paged.

## 2019-03-04 DEATH — deceased

## 2019-04-03 NOTE — Death Summary Note (Signed)
DEATH SUMMARY   Patient Details  Name: Audrey Ochoa MRN: [id number] DOB: 04-18-1951  Admission/Discharge Information   Admit Date:  02/05/2019  Date of Death: Date of Death: 2019/02/22  Time of Death: Time of Death: 08/20/15  Length of Stay: 5  Referring Physician: Theotis Burrow, MD   Reason(s) for Hospitalization  shortness of breath  Diagnoses  Preliminary cause of death:  end-stage respiratory failure with severe COPD severe protein calorie malnutrition Secondary Diagnoses (including complications and co-morbidities):  Active Problems:   COPD exacerbation (HCC)   Protein-calorie malnutrition, severe   Palliative care by specialist   DNR (do not resuscitate) discussion   Acute respiratory failure with hypoxia ([place])   Goals of care, counseling/discussion   Brief Hospital Course (including significant findings, care, treatment, and services provided and events leading to death)  Audrey Ochoa is a 68 y.o. year old female who has history of emphysema was admitted with acute on chronic exacerbation of COPD with hypoxic respiratory failure. She was initially intubated however after female discussion was done by ICU attending patient was terminally extubated and placed under comfort care. Patient died on 2019-02-22 at [place] and Studies  Significant Diagnostic Studies Dg Abd 1 View  Result Date: 02/13/2019 CLINICAL DATA:  Encounter for tube placement EXAM: ABDOMEN - 1 VIEW COMPARISON:  Abdominal radiograph 05/15/2018 FINDINGS: Interval placement of a nasogastric tube with side port projecting over the stomach. No dilated loops of bowel in the partially visualized abdomen to suggest obstruction. No evidence of free air. Severe degenerative changes in the lumbar spine. IMPRESSION: Appropriate positioning of the nasogastric tube with side port projecting over the stomach. Electronically Signed   By: Audie Pinto M.D.   On: 02/13/2019 12:51   Dg Chest  Port 1 View  Result Date: 02/14/2019 CLINICAL DATA:  Acute respiratory failure EXAM: PORTABLE CHEST 1 VIEW COMPARISON:  Radiograph 02/13/2019 FINDINGS: Endotracheal tube in the mid trachea, 3.3 cm from the carina. Transesophageal tube tip and side port are distal to the GE junction. Hyperinflation of the lungs with chronic interstitial opacities and chronic bronchitic changes. No new consolidative process or features of edema. No pneumothorax or effusion. No acute osseous or soft tissue abnormality. Degenerative changes are present in the imaged spine and shoulders. IMPRESSION: Satisfactory positioning of the endotracheal and transesophageal tubes. No significant interval change from 1 day prior. Chronic bronchitic changes and features of emphysema. Electronically Signed   By: Lovena Le M.D.   On: 02/14/2019 04:48   Dg Chest Port 1 View  Result Date: 02/13/2019 CLINICAL DATA:  Encounter for tube placement EXAM: PORTABLE CHEST 1 VIEW COMPARISON:  Chest radiograph February 17, 2019 FINDINGS: Interval placement of an endotracheal tube with tip projecting between the thoracic inlet and carina. Interval placement of a nasogastric tube with side port projecting over the stomach. Lungs remain hyperinflated consistent with COPD. Bilateral diffuse coarse interstitial opacities consistent with chronic bronchitic change. Emphysema. No new focal infiltrate. No pneumothorax or large pleural effusion. Visualized skeleton unremarkable. IMPRESSION: Appropriate positioning of the endotracheal tube and nasogastric tube. Emphysema and COPD. Electronically Signed   By: Audie Pinto M.D.   On: 02/13/2019 12:49   Dg Chest Portable 1 View  Result Date: 02/26/2019 CLINICAL DATA:  Shortness of breath EXAM: PORTABLE CHEST 1 VIEW COMPARISON:  05/18/2018 FINDINGS: There is hyperinflation of the lungs compatible with COPD. Heart and mediastinal contours are within normal limits. No focal opacities or effusions. No acute bony  abnormality. IMPRESSION: COPD.  No active disease. Electronically Signed   By: Charlett Nose M.D.   On: 02-22-19 11:17    Microbiology No results found for this or any previous visit (from the past 240 hour(s)).  Lab Basic Metabolic Panel: No results for input(s): NA, K, CL, CO2, GLUCOSE, BUN, CREATININE, CALCIUM, MG, PHOS in the last 168 hours. Liver Function Tests: No results for input(s): AST, ALT, ALKPHOS, BILITOT, PROT, ALBUMIN in the last 168 hours. No results for input(s): LIPASE, AMYLASE in the last 168 hours. No results for input(s): AMMONIA in the last 168 hours. CBC: No results for input(s): WBC, NEUTROABS, HGB, HCT, MCV, PLT in the last 168 hours. Cardiac Enzymes: No results for input(s): CKTOTAL, CKMB, CKMBINDEX, TROPONINI in the last 168 hours. Sepsis Labs: No results for input(s): PROCALCITON, WBC, LATICACIDVEN in the last 168 hours.  Procedures/Operations     Enedina Finner 03/09/2019, 12:51 PM

## 2020-12-11 IMAGING — DX DG ABDOMEN 1V
1 series · 1 of 1 positions shown · non-contrast
Comparison: Abdominal radiograph 05/15/2018

CLINICAL DATA: Encounter for tube placement

EXAM:
ABDOMEN - 1 VIEW

[abdomen supine]
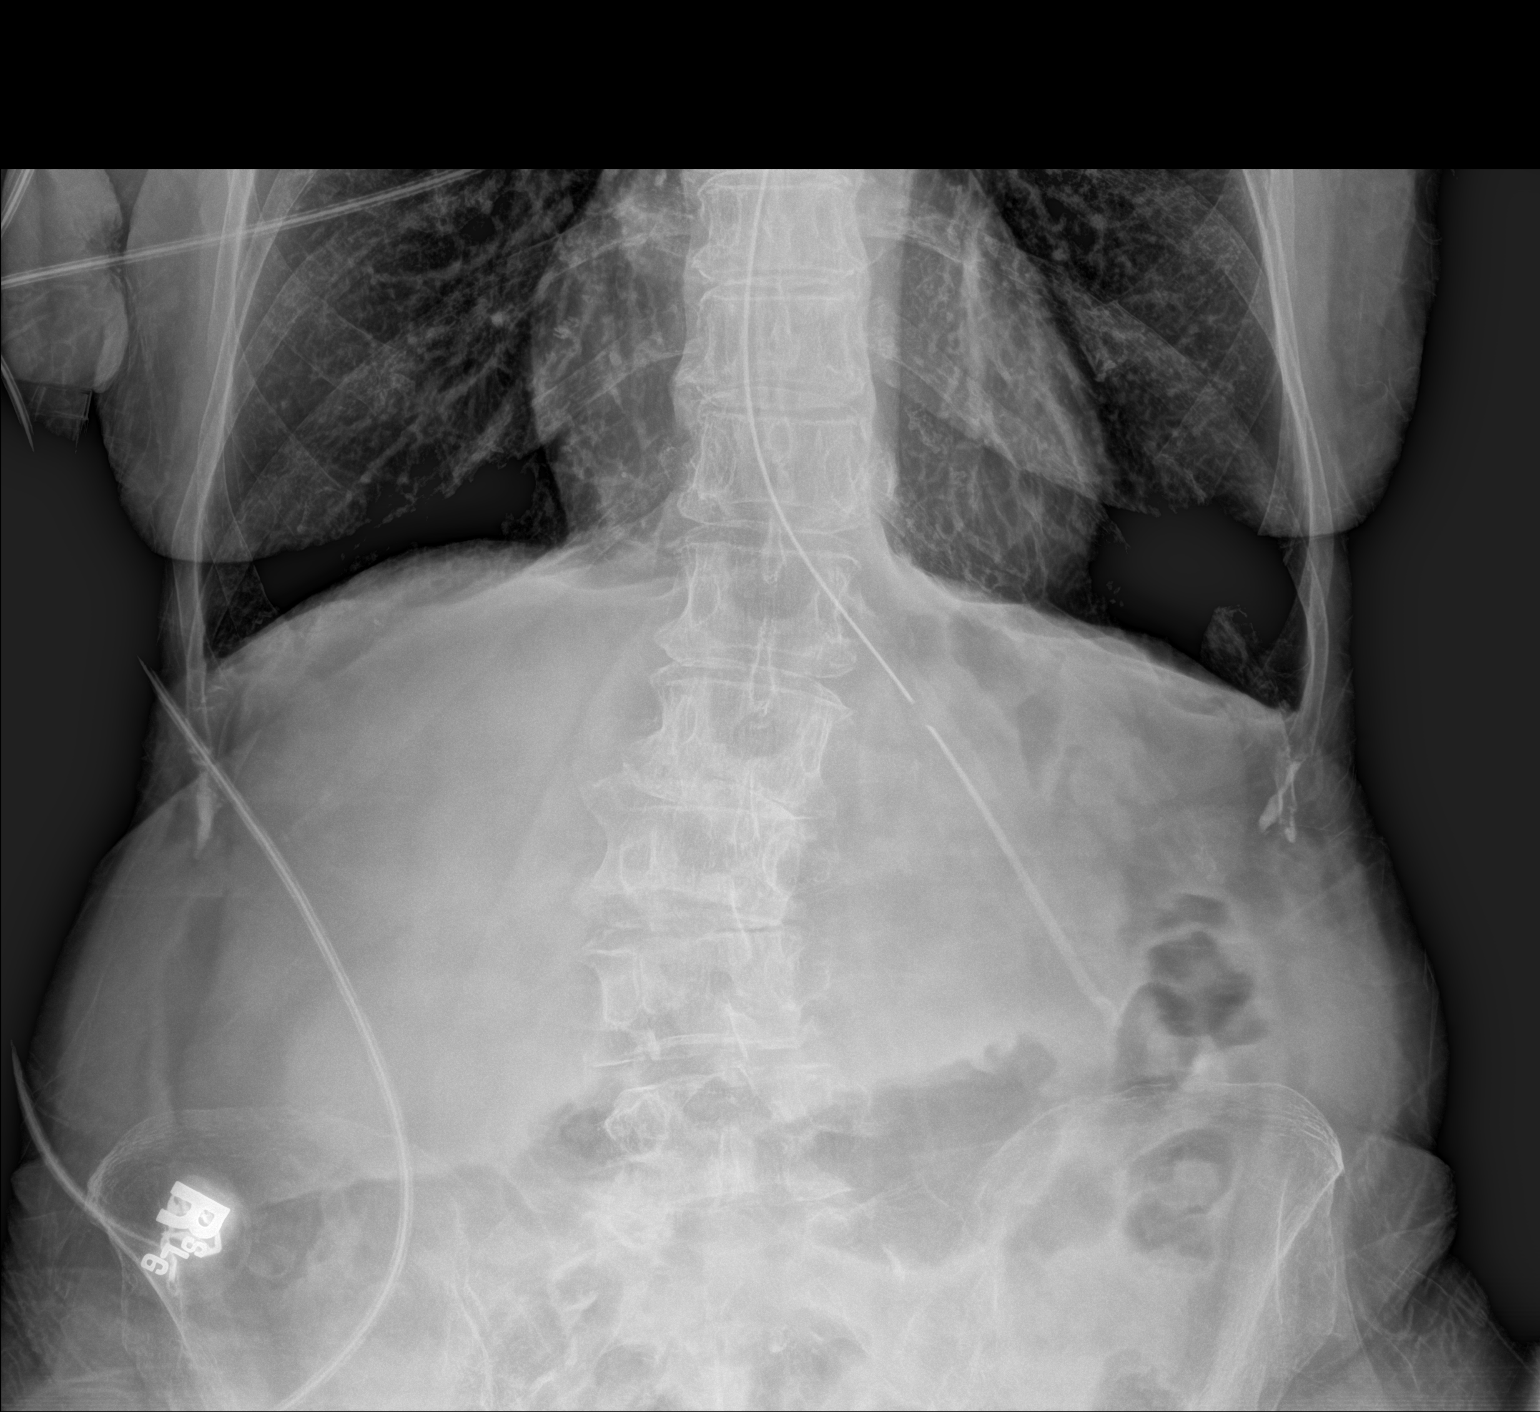

[1 of 1 positions shown; findings below may reference images not displayed]

FINDINGS: Interval placement of a nasogastric tube with side port projecting
over the stomach. No dilated loops of bowel in the partially
visualized abdomen to suggest obstruction. No evidence of free air.
Severe degenerative changes in the lumbar spine.
IMPRESSION: Appropriate positioning of the nasogastric tube with side port
projecting over the stomach.

## 2020-12-12 IMAGING — DX DG CHEST 1V PORT
2 series · 2 of 2 positions shown · non-contrast
Comparison: Radiograph 02/13/2019

CLINICAL DATA: Acute respiratory failure

EXAM:
PORTABLE CHEST 1 VIEW

[chest ap (1 of 2)]
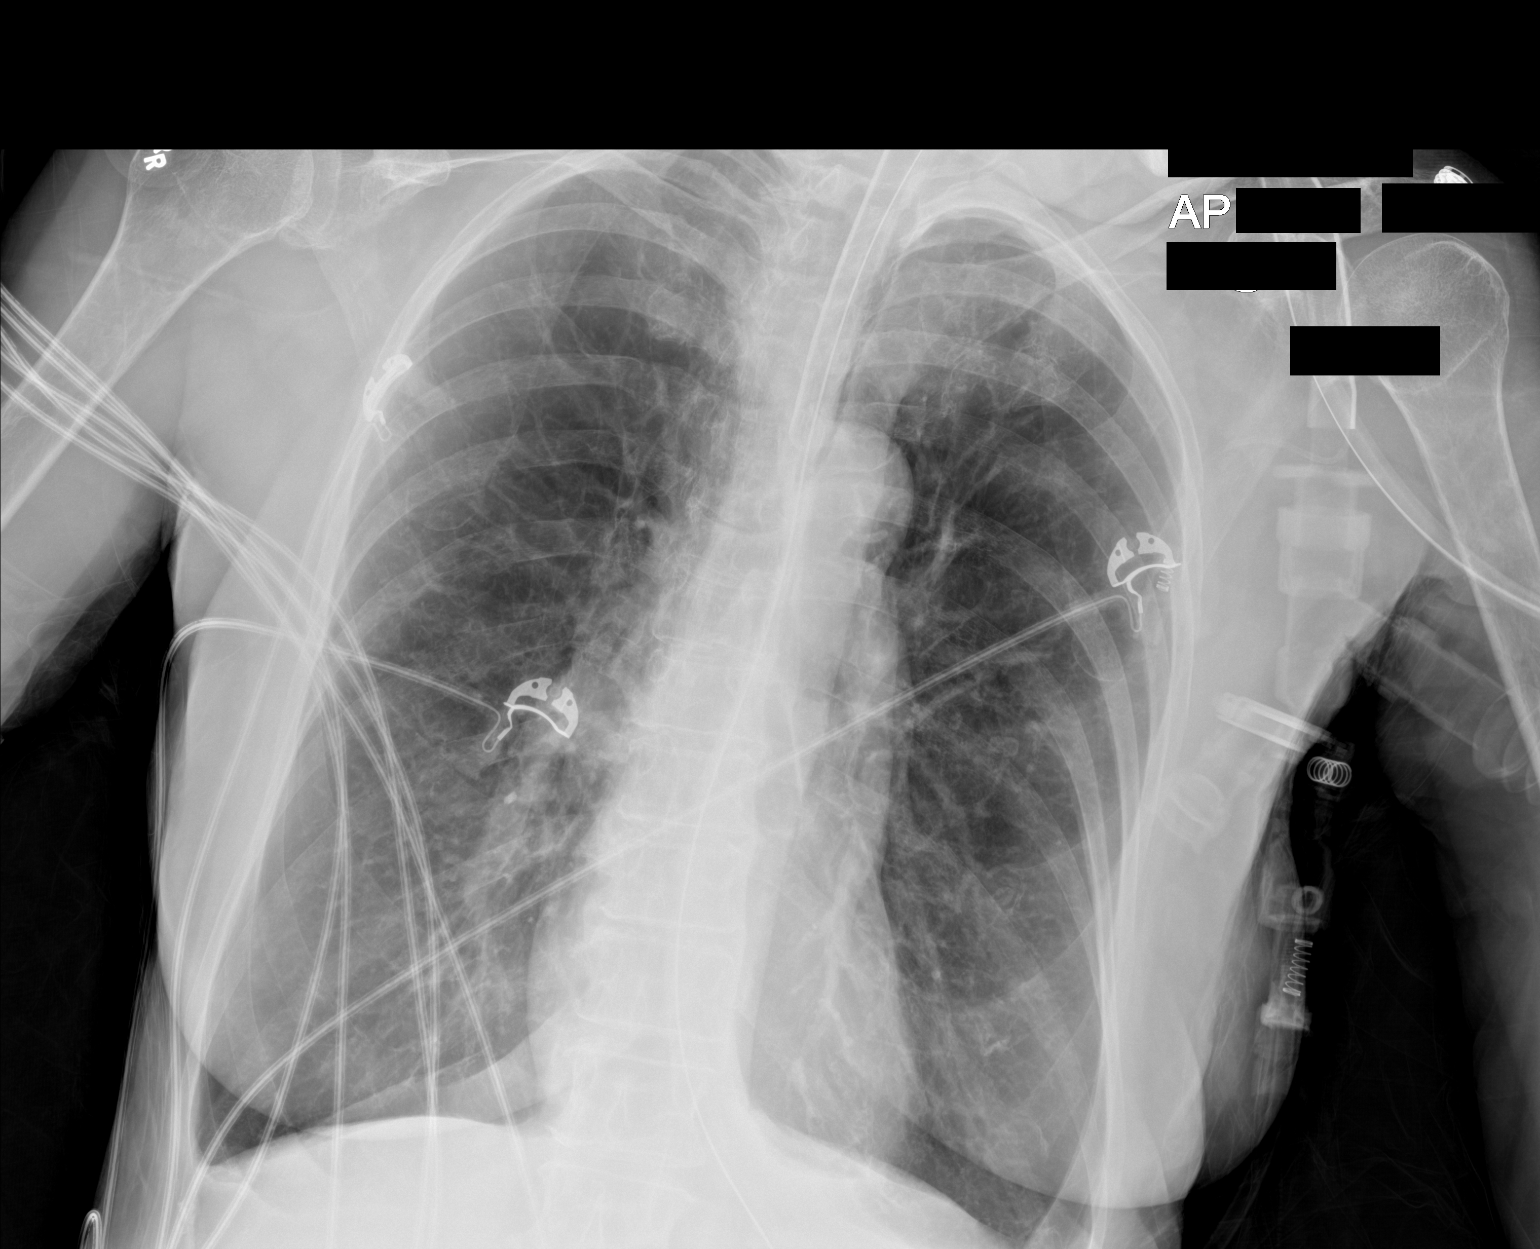

[chest ap (2 of 2)]
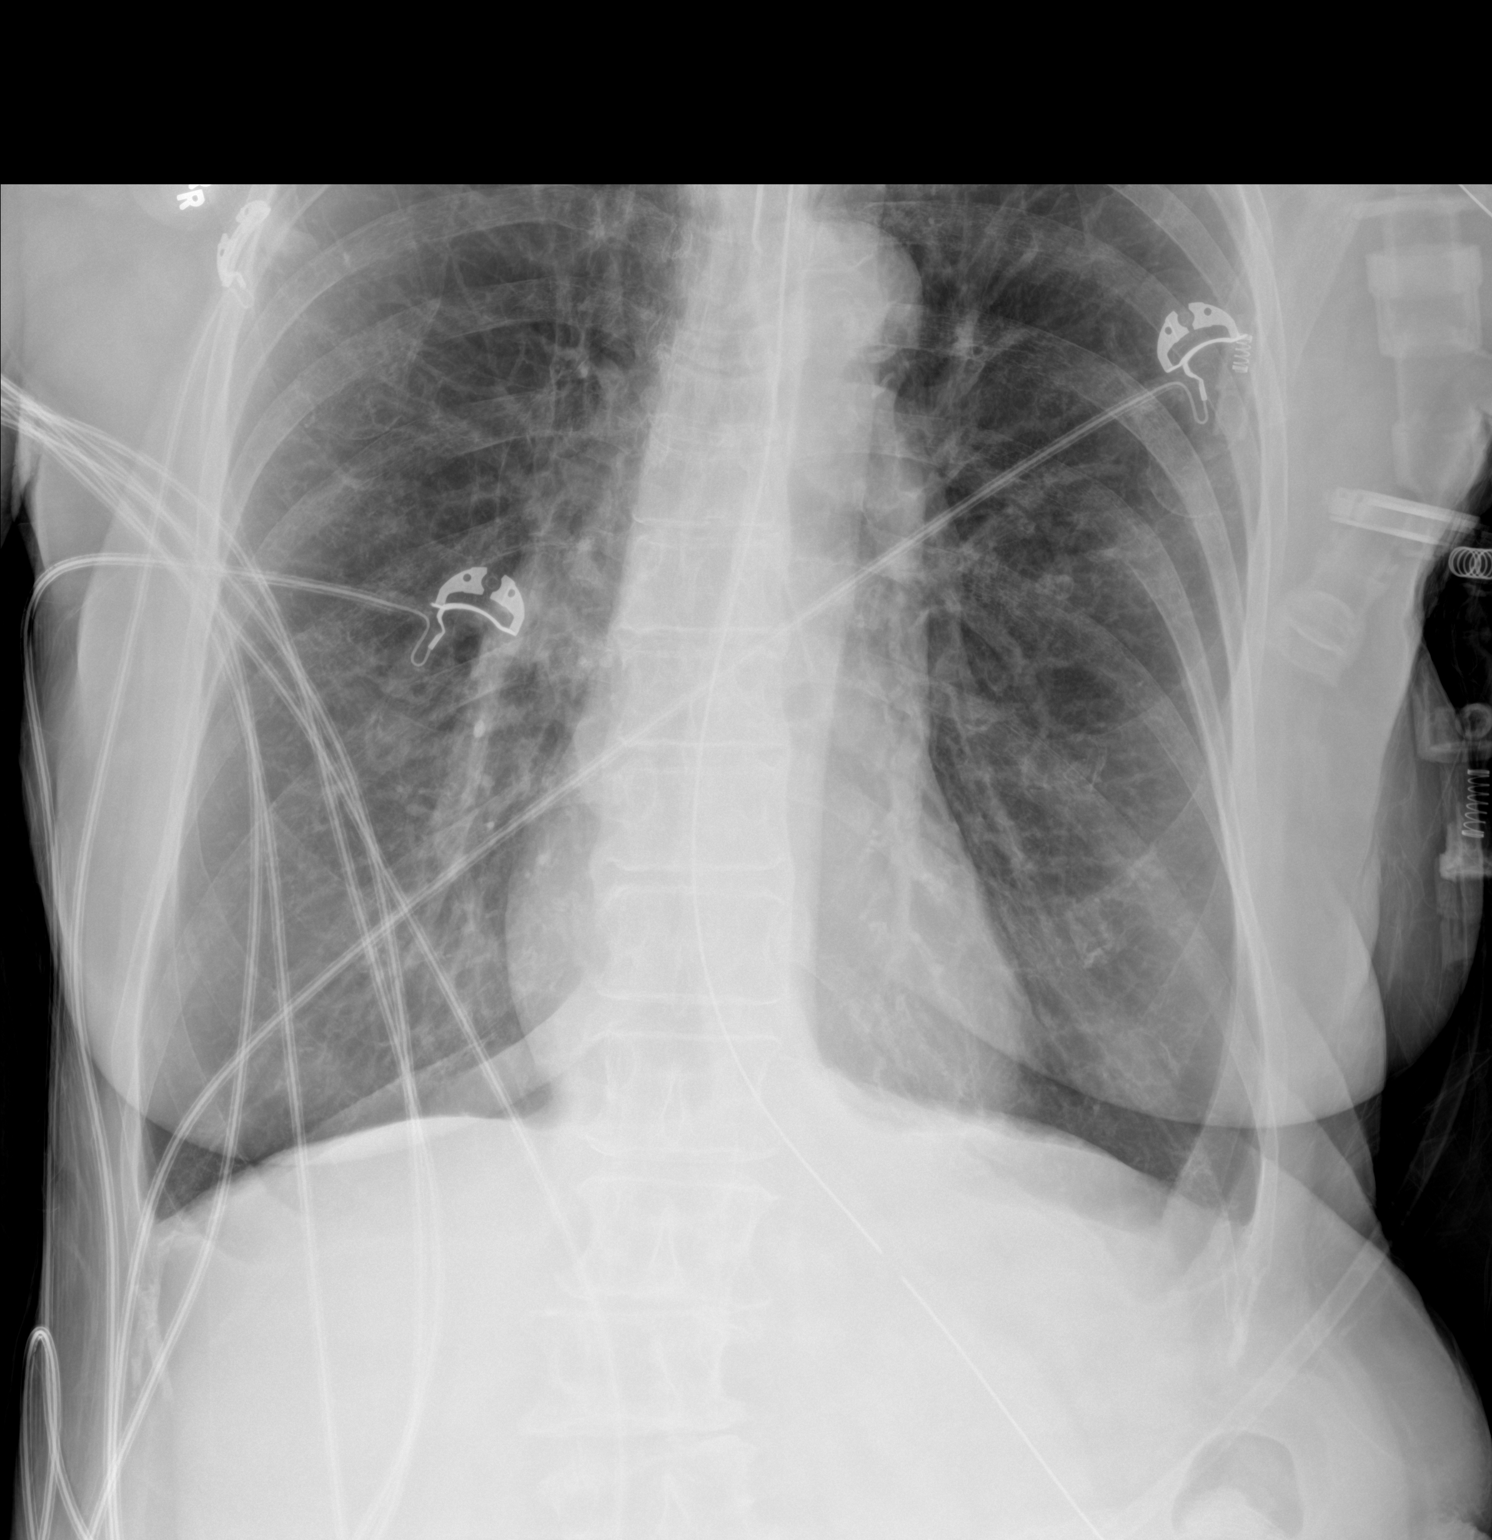

[2 of 2 positions shown; findings below may reference images not displayed]

FINDINGS: Endotracheal tube in the mid trachea, 3.3 cm from the carina.
Transesophageal tube tip and side port are distal to the GE
junction.

Hyperinflation of the lungs with chronic interstitial opacities and
chronic bronchitic changes. No new consolidative process or features
of edema. No pneumothorax or effusion. No acute osseous or soft
tissue abnormality. Degenerative changes are present in the imaged
spine and shoulders.
IMPRESSION: Satisfactory positioning of the endotracheal and transesophageal
tubes.

No significant interval change from 1 day prior.

Chronic bronchitic changes and features of emphysema.
